# Patient Record
Sex: Female | Born: 1990 | Hispanic: Yes | Marital: Single | Smoking: Never smoker
Health system: Southern US, Community
[De-identification: ages and names within clinical notes are randomized; demographics above are authoritative.]

## PROBLEM LIST (undated history)

## (undated) DIAGNOSIS — R87629 Unspecified abnormal cytological findings in specimens from vagina: Secondary | ICD-10-CM

## (undated) DIAGNOSIS — D649 Anemia, unspecified: Secondary | ICD-10-CM

## (undated) DIAGNOSIS — O24419 Gestational diabetes mellitus in pregnancy, unspecified control: Secondary | ICD-10-CM

## (undated) HISTORY — DX: Anemia, unspecified: D64.9

## (undated) HISTORY — DX: Gestational diabetes mellitus in pregnancy, unspecified control: O24.419

## (undated) HISTORY — PX: OTHER SURGICAL HISTORY: SHX169

## (undated) HISTORY — DX: Unspecified abnormal cytological findings in specimens from vagina: R87.629

---

## 2015-11-15 LAB — OB RESULTS CONSOLE RUBELLA ANTIBODY, IGM: Rubella: IMMUNE

## 2015-12-26 LAB — SICKLE CELL SCREEN: Sickle Cell Screen: NORMAL

## 2016-05-07 ENCOUNTER — Encounter (INDEPENDENT_AMBULATORY_CARE_PROVIDER_SITE_OTHER): Payer: Self-pay

## 2016-05-07 ENCOUNTER — Inpatient Hospital Stay: Payer: Self-pay | Attending: Obstetrics and Gynecology | Admitting: Obstetrics and Gynecology

## 2016-05-07 ENCOUNTER — Encounter: Payer: Self-pay | Admitting: *Deleted

## 2016-05-07 ENCOUNTER — Ambulatory Visit: Payer: Self-pay | Attending: Oncology | Admitting: *Deleted

## 2016-05-07 VITALS — BP 104/72 | HR 58 | Temp 97.4°F | Resp 18 | Ht 60.0 in | Wt 170.0 lb

## 2016-05-07 DIAGNOSIS — R87612 Low grade squamous intraepithelial lesion on cytologic smear of cervix (LGSIL): Secondary | ICD-10-CM

## 2016-05-07 DIAGNOSIS — Z8742 Personal history of other diseases of the female genital tract: Secondary | ICD-10-CM | POA: Insufficient documentation

## 2016-05-07 NOTE — Progress Notes (Signed)
Chaperoned pelvic exam. Pap/hpv obtained from Dr. Sonia Side and sent to lab. Oncology Nurse Navigator Documentation      )

## 2016-05-07 NOTE — Progress Notes (Signed)
Gynecologic Oncology Consult Visit   Referring Provider: [facility]  Chief Concern: LGSIL Pap  Subjective:  Sierra Reynolds is a 26 y.o. G1P1 female who is seen in consultation from Sierra Posey, RN from [facility] for LGSIL Pap with HPV effect. Her Pap was obtained on 11/15/2015 during her pregnancy. Findings included HPV effect. She has no symptoms. She is very healthy and her only medical issues is Gestational Diabetes.    Problem List: Patient Active Problem List   Diagnosis Date Noted  . Low grade squamous intraepithelial lesion on cytologic smear of cervix (LGSIL) 05/07/2016    Past Medical History: Past Medical History:  Diagnosis Date  . Gestational diabetes     Past Surgical History: History reviewed. No pertinent surgical history.  Past Gynecologic History:  Menarche: 11 Menstrual details: Lasts 7 days Menses regular: yes every 25 days Last Menstrual Period: 04/15/2016 History of OCP/HRT use: not at this time, not sexually active at this time as her [place] is outside the country History of Abnormal pap: yes, as per interval history Last pap: as per interval history History of STDs: The patient denies history of sexually transmitted disease. Contraception: no method Sexually active: not at this time   OB History:  OB History  Gravida Para Term Preterm AB Living  1 1          SAB TAB Ectopic Multiple Live Births               # Outcome Date GA Lbr Len/2nd Weight Sex Delivery Anes PTL Lv  1 Para             Obstetric Comments  Delivery 01/29/2016    Family History: Family History  Problem Relation Age of Onset  . Heart disease Maternal Grandmother   . Heart disease Maternal Grandfather     Social History: Social History   Social History  . Marital status: Married    Spouse name: N/A  . Number of children: N/A  . Years of education: N/A   Occupational History  . Not on file.   Social History Main Topics  . Smoking status: Never Smoker  .  Smokeless tobacco: Never Used  . Alcohol use No  . Drug use: No  . Sexual activity: Yes   Other Topics Concern  . Not on file   Social History Narrative  . No narrative on file    Allergies: No Known Allergies  Current Medications: No current outpatient prescriptions on file.   No current facility-administered medications for this visit.     Review of Systems General: negative for, fevers Skin: negative Eyes: negative for, changes in vision HEENT: negative for, change in hearing, voice changes Pulmonary: negative for, dyspnea, productive cough Cardiac: negative for, palpitations, pain Gastrointestinal: positive for constipation, negative for, nausea, vomiting, diarrhea, hematemesis, hematochezia Genitourinary/Sexual: negative for, dysuria, incontinence Ob/Gyn: negative for, irregular bleeding, pain Musculoskeletal: negative for, pain Hematology: negative for, easy bruising, bleeding Neurologic/Psych: positive for headaches, negative for, seizures, paralysis, weakness. Headaches occur once a week.   Objective:  Physical Examination:  BP 104/72   Pulse (!) 58   Temp 97.4 F (36.3 C) (Tympanic)   Resp 18   Ht 5' (1.524 m)   Wt 170 lb (77.1 kg)   BMI 33.20 kg/m    ECOG Performance Status: 0 - Asymptomatic  General appearance: alert, cooperative and appears stated age HEENT:PERRLA, extra ocular movement intact and sclera clear, anicteric Lymph node survey: non-palpable, axillary, inguinal, supraclavicular Abdomen: soft, non-tender, without  masses or organomegaly Extremities: extremities normal, atraumatic, no cyanosis or edema Neurological exam reveals alert, oriented, normal speech, no focal findings or movement disorder noted.  Pelvic: exam chaperoned by nurse;  Vulva: normal appearing vulva with no masses, tenderness or lesions; Vagina: normal vagina; Adnexa: normal adnexa in size, nontender and no masses; Uterus:seems normal in size, shape, consistency and  appropriately tender, exam was limited by habitus; Cervix: no lesions; Rectal: not indicated  Colposcopy of the cervix  PROCEDURE: The risks and benefits of the procedure were reviewed and informed consent obtained. Time out was performed. The patient received pre-procedure teaching and expressed understanding. The post-procedure instructions were reviewed with the patient and she expressed understanding. The patient does not have any barriers to learning.  Colposcopy of the upper vagina and cervix was performed after application os acetic acid. The findings revealed AWE at 12 and 5 o'clock of the cervix as well as AWE area 10 mm along the right lateral vaginal wall at 9 o'clock - findings c/w CIN1 at the most. The transformation zone was seen. Biopsies were not performed. Procedure completed without complications. The patient tolerated the procedure well.   Post-procedure evaluation the patient was stable without complaints.  Physical Exam  Genitourinary:      Lab Review none  Radiologic Imaging: none    Assessment:  Sierra Reynolds is a 27 y.o. 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  Medical co-morbidities complicating care: Body mass index is 33.2 kg/m.  Plan:   Problem List Items Addressed This Visit      Other   Low grade squamous intraepithelial lesion on cytologic smear of cervix (LGSIL) - Primary      We discussed options for management and I recommended follow up of today's Pap and HPV tests. If LGSIL or normal then repeat cotesting in one year.   I recommended to [facility] that the patient follow up with her Ob/Gyn regarding contraception.   The patient's diagnosis, an outline of the further diagnostic and laboratory studies which will be required, the recommendation, and alternatives were discussed.  All questions were answered to the patient's satisfaction.  A total of 45 minutes were spent with  the patient/family today; 50% was spent in education, counseling and coordination of care for abnormal pap.   Sierra Laroche, MD

## 2016-05-07 NOTE — Progress Notes (Addendum)
26 year old Hispanic female referred to [facility] by the [facility] Department for further evaluation of an abormal pap dated 119/17 of LGSIL.  HPV was not tested.  Patient is postpartum from vaginal delivery January 2018.  Patient has been screened for eligibility.  She does not have any insurance, Medicare or Medicaid.  She also meets financial eligibility.  Hand-out given on the Affordable Care Act.  Dr. Sonia Side, GYN Oncologist here at the cancer center, was available today, and agreed to see the patient.  Colposcopy was preformed.  Per Dr. Sonia Side she did not need a biopsy, but she did do another pap.  Discussed with patient that she would need to follow up with Dr. Sonia Side in one year unless otherwise indicated.  Will schedule her once we have her pap results.  Maritza, the interpreter was present during the interview and exam.  Will follow up per protcol.

## 2016-05-12 LAB — PAP LB AND HPV HIGH-RISK
HPV, high-risk: POSITIVE — AB
PAP SMEAR COMMENT: 0

## 2016-05-19 ENCOUNTER — Ambulatory Visit: Payer: Self-pay

## 2016-05-20 ENCOUNTER — Encounter: Payer: Self-pay | Admitting: *Deleted

## 2016-05-20 NOTE — Progress Notes (Signed)
Letter mailed to inform patient of her pap smear results and need to return in one year per Dr. Malva CoganSecord's recommendation.  She is scheduled to see Dr. Sonia SideSecord at 9:00 on 05/06/17 and [facility] at 9:30.  [facility] indicated per Dr. Sonia SideSecord.  HSIS to [place].

## 2017-01-06 NOTE — L&D Delivery Note (Signed)
Delivery Note Primary OB: [facility] Delivery Physician: Annamarie MajorPaul Yoona Ishii, MD Gestational Age: Full term Antepartum complications: post-term Intrapartum complications: None  A viable Female was delivered via vertex perentation.  Apgars:8 ,9  Weight:  9 lb 5 oz .   Placenta status: spontaneous and Intact.  Cord: 3+ vessels;  with the following complications: none.  Anesthesia:  epidural Episiotomy:  none Lacerations:  2nd Suture Repair: 2.0 vicryl Est. Blood Loss (mL):  less than 100 mL  Mom to postpartum.  Baby to Couplet care / Skin to Skin.  Annamarie MajorPaul Han Vejar, MD, Merlinda FrederickFACOG [facility], [facility] 08/06/2017  3:23 PM [phone]

## 2017-02-17 ENCOUNTER — Other Ambulatory Visit: Payer: Self-pay | Admitting: Nurse Practitioner

## 2017-02-17 DIAGNOSIS — Z3482 Encounter for supervision of other normal pregnancy, second trimester: Secondary | ICD-10-CM

## 2017-03-10 ENCOUNTER — Ambulatory Visit
Admission: RE | Admit: 2017-03-10 | Discharge: 2017-03-10 | Disposition: A | Discharge status: Home / Self Care | Payer: Self-pay | Source: Ambulatory Visit | Attending: Nurse Practitioner | Admitting: Nurse Practitioner

## 2017-03-10 DIAGNOSIS — Z3482 Encounter for supervision of other normal pregnancy, second trimester: Secondary | ICD-10-CM | POA: Insufficient documentation

## 2017-03-10 DIAGNOSIS — Z363 Encounter for antenatal screening for malformations: Secondary | ICD-10-CM | POA: Insufficient documentation

## 2017-03-12 ENCOUNTER — Ambulatory Visit: Payer: Self-pay

## 2017-05-06 ENCOUNTER — Ambulatory Visit: Payer: Self-pay

## 2017-08-02 ENCOUNTER — Other Ambulatory Visit: Payer: Self-pay | Admitting: Obstetrics & Gynecology

## 2017-08-02 ENCOUNTER — Other Ambulatory Visit: Payer: Self-pay

## 2017-08-02 ENCOUNTER — Observation Stay
Admission: EM | Admit: 2017-08-02 | Discharge: 2017-08-02 | Disposition: A | Discharge status: Home / Self Care | Payer: Self-pay | Attending: Obstetrics & Gynecology | Admitting: Obstetrics & Gynecology

## 2017-08-02 DIAGNOSIS — Z3A00 Weeks of gestation of pregnancy not specified: Secondary | ICD-10-CM | POA: Insufficient documentation

## 2017-08-02 DIAGNOSIS — O480 Post-term pregnancy: Secondary | ICD-10-CM

## 2017-08-02 DIAGNOSIS — R109 Unspecified abdominal pain: Secondary | ICD-10-CM

## 2017-08-02 DIAGNOSIS — O26893 Other specified pregnancy related conditions, third trimester: Principal | ICD-10-CM | POA: Insufficient documentation

## 2017-08-02 DIAGNOSIS — Z3689 Encounter for other specified antenatal screening: Secondary | ICD-10-CM

## 2017-08-02 DIAGNOSIS — O26899 Other specified pregnancy related conditions, unspecified trimester: Secondary | ICD-10-CM | POA: Diagnosis present

## 2017-08-02 DIAGNOSIS — Z3A40 40 weeks gestation of pregnancy: Secondary | ICD-10-CM

## 2017-08-02 MED ORDER — ACETAMINOPHEN 325 MG PO TABS: 650.0000 mg | ORAL_TABLET | ORAL | Status: DC | PRN

## 2017-08-02 NOTE — Discharge Summary (Signed)
  See FPN 

## 2017-08-02 NOTE — Final Progress Note (Signed)
Physician Final Progress Note  Patient ID: Sierra Reynolds MRN: [id number] DOB/AGE: 29/01/1990 29 y.o.  Admit date: 08/02/2017 Admitting provider: Nadara Mustardobert P Keiasia Christianson, MD Discharge date: 08/02/2017  Admission Diagnoses: Abdominal pain, thirds trimester, Post Dates  Discharge Diagnoses:  Active Problems:   Cramping affecting pregnancy, antepartum     Consults: None  Significant Findings/ Diagnostic Studies: Pt seen, examines, and monitored in triage. Patient presented for evaluation of labor.  Patient had cervical exam by RN and this was reported to me. I reviewed her vital signs and fetal tracing, both of which were reassuring.  Patient was discharge as she was not laboring.  Procedures: A NST procedure was performed with FHR monitoring and a normal baseline established, appropriate time of 20-40 minutes of evaluation, and accels >2 seen w 15x15 characteristics.  Results show a REACTIVE NST.   Discharge Condition: good  Disposition: Discharge disposition: 01-Home or Self Care       Diet: Regular diet  Discharge Activity: Activity as tolerated  Discharge Instructions    Call MD for:   Complete by:  As directed    Worsening contractions or pain; leakage of fluid; bleeding.   Diet general   Complete by:  As directed    Increase activity slowly   Complete by:  As directed      Allergies as of 08/02/2017   Not on File     Medication List    TAKE these medications   prenatal multivitamin Tabs tablet Take 1 tablet by mouth daily at 12 noon.      Follow-up Information    [facility]. Go to.   Why:  return to the emergency department at 0500 am on Thursday August 1st 2019 for induction of labor  Contact information: [address] [place] [phone]          Total time spent taking care of this patient: TRIAGE  Signed: Letitia LibraRobert Paul Emelynn Rance 08/02/2017, 2:42 PM

## 2017-08-02 NOTE — OB Triage Note (Signed)
Patient discharged to home as she has not made any change. [facility] Spoke to dr Tiburcio Peaharris who agreed to induce patient on Thursday at 0500. Went over this information with the patient via interpreter about when to return to the hospital if labor progresses between now and Thursday. She understands and agrees to these instructions.

## 2017-08-02 NOTE — OB Triage Note (Signed)
Patient here for complaints of cramping. Denies LOF, has had some mucous and small amount pf bloody discharge the past few days

## 2017-08-03 ENCOUNTER — Encounter (HOSPITAL_COMMUNITY): Payer: Self-pay

## 2017-08-06 ENCOUNTER — Other Ambulatory Visit: Payer: Self-pay

## 2017-08-06 ENCOUNTER — Inpatient Hospital Stay
Admission: AD | Admit: 2017-08-06 | Discharge: 2017-08-07 | DRG: 807 | Disposition: A | Discharge status: Home / Self Care | Payer: Medicaid Other | Attending: Obstetrics & Gynecology | Admitting: Obstetrics & Gynecology

## 2017-08-06 ENCOUNTER — Inpatient Hospital Stay: Payer: Medicaid Other | Admitting: Registered Nurse

## 2017-08-06 DIAGNOSIS — O09293 Supervision of pregnancy with other poor reproductive or obstetric history, third trimester: Secondary | ICD-10-CM

## 2017-08-06 DIAGNOSIS — Z3A41 41 weeks gestation of pregnancy: Secondary | ICD-10-CM | POA: Diagnosis not present

## 2017-08-06 DIAGNOSIS — O99214 Obesity complicating childbirth: Secondary | ICD-10-CM | POA: Diagnosis present

## 2017-08-06 DIAGNOSIS — O480 Post-term pregnancy: Principal | ICD-10-CM | POA: Diagnosis present

## 2017-08-06 HISTORY — DX: Morbid (severe) obesity due to excess calories: E66.01

## 2017-08-06 LAB — CBC
HCT: 34.5 % — ABNORMAL LOW (ref 35.0–47.0)
HEMOGLOBIN: 11.7 g/dL — AB (ref 12.0–16.0)
MCH: 29.8 pg (ref 26.0–34.0)
MCHC: 33.9 g/dL (ref 32.0–36.0)
MCV: 88 fL (ref 80.0–100.0)
PLATELETS: 195 10*3/uL (ref 150–440)
RBC: 3.92 MIL/uL (ref 3.80–5.20)
RDW: 14.5 % (ref 11.5–14.5)
WBC: 4.4 10*3/uL (ref 3.6–11.0)

## 2017-08-06 LAB — TYPE AND SCREEN
ABO/RH(D): A POS
ANTIBODY SCREEN: NEGATIVE

## 2017-08-06 MED ORDER — IBUPROFEN 600 MG PO TABS
600.0000 mg | ORAL_TABLET | Freq: Four times a day (QID) | ORAL | Status: DC
  Administered 2017-08-06 – 2017-08-07 (×2): 600 mg via ORAL
  Filled 2017-08-06 (×4): qty 1

## 2017-08-06 MED ORDER — OXYTOCIN BOLUS FROM INFUSION
500.0000 mL | Freq: Once | INTRAVENOUS | Status: AC
  Administered 2017-08-06: 500 mL via INTRAVENOUS

## 2017-08-06 MED ORDER — BUPIVACAINE HCL (PF) 0.25 % IJ SOLN
INTRAMUSCULAR | Status: DC | PRN
  Administered 2017-08-06: 3 mL via EPIDURAL

## 2017-08-06 MED ORDER — ZOLPIDEM TARTRATE 5 MG PO TABS: 5.0000 mg | ORAL_TABLET | Freq: Every evening | ORAL | Status: DC | PRN

## 2017-08-06 MED ORDER — SODIUM CHLORIDE 0.9% FLUSH: 3.0000 mL | Freq: Two times a day (BID) | INTRAVENOUS | Status: DC

## 2017-08-06 MED ORDER — EPHEDRINE 5 MG/ML INJ
10.0000 mg | INTRAVENOUS | Status: DC | PRN
  Filled 2017-08-06: qty 2

## 2017-08-06 MED ORDER — DIBUCAINE 1 % RE OINT: 1.0000 "application " | TOPICAL_OINTMENT | RECTAL | Status: DC | PRN

## 2017-08-06 MED ORDER — WITCH HAZEL-GLYCERIN EX PADS: 1.0000 "application " | MEDICATED_PAD | CUTANEOUS | Status: DC | PRN

## 2017-08-06 MED ORDER — LIDOCAINE HCL (PF) 1 % IJ SOLN
INTRAMUSCULAR | Status: AC
  Filled 2017-08-06: qty 30

## 2017-08-06 MED ORDER — ONDANSETRON HCL 4 MG/2ML IJ SOLN: 4.0000 mg | Freq: Four times a day (QID) | INTRAMUSCULAR | Status: DC | PRN

## 2017-08-06 MED ORDER — TERBUTALINE SULFATE 1 MG/ML IJ SOLN: 0.2500 mg | Freq: Once | INTRAMUSCULAR | Status: DC | PRN

## 2017-08-06 MED ORDER — OXYTOCIN 40 UNITS IN LACTATED RINGERS INFUSION - SIMPLE MED
1.0000 m[IU]/min | INTRAVENOUS | Status: DC
  Administered 2017-08-06: 2 m[IU]/min via INTRAVENOUS
  Filled 2017-08-06: qty 1000

## 2017-08-06 MED ORDER — MISOPROSTOL 25 MCG QUARTER TABLET: 25.0000 ug | ORAL_TABLET | ORAL | Status: DC | PRN

## 2017-08-06 MED ORDER — ACETAMINOPHEN 325 MG PO TABS: 650.0000 mg | ORAL_TABLET | ORAL | Status: DC | PRN

## 2017-08-06 MED ORDER — OXYCODONE-ACETAMINOPHEN 5-325 MG PO TABS: 1.0000 | ORAL_TABLET | ORAL | Status: DC | PRN

## 2017-08-06 MED ORDER — BENZOCAINE-MENTHOL 20-0.5 % EX AERO: 1.0000 "application " | INHALATION_SPRAY | CUTANEOUS | Status: DC | PRN

## 2017-08-06 MED ORDER — COCONUT OIL OIL: 1.0000 "application " | TOPICAL_OIL | Status: DC | PRN

## 2017-08-06 MED ORDER — DIPHENHYDRAMINE HCL 50 MG/ML IJ SOLN: 12.5000 mg | INTRAMUSCULAR | Status: DC | PRN

## 2017-08-06 MED ORDER — ONDANSETRON HCL 4 MG PO TABS: 4.0000 mg | ORAL_TABLET | ORAL | Status: DC | PRN

## 2017-08-06 MED ORDER — OXYCODONE-ACETAMINOPHEN 5-325 MG PO TABS
2.0000 | ORAL_TABLET | ORAL | Status: DC | PRN
Start: 2017-08-06 — End: 2017-08-07

## 2017-08-06 MED ORDER — MISOPROSTOL 200 MCG PO TABS
ORAL_TABLET | ORAL | Status: AC
  Filled 2017-08-06: qty 4

## 2017-08-06 MED ORDER — BUTORPHANOL TARTRATE 1 MG/ML IJ SOLN: 1.0000 mg | INTRAMUSCULAR | Status: DC | PRN

## 2017-08-06 MED ORDER — FENTANYL 2.5 MCG/ML W/ROPIVACAINE 0.15% IN NS 100 ML EPIDURAL (ARMC)
EPIDURAL | Status: DC | PRN
  Administered 2017-08-06: 8 mL/h via EPIDURAL

## 2017-08-06 MED ORDER — LIDOCAINE HCL (PF) 1 % IJ SOLN
INTRAMUSCULAR | Status: DC | PRN
  Administered 2017-08-06: 3 mL via SUBCUTANEOUS

## 2017-08-06 MED ORDER — SIMETHICONE 80 MG PO CHEW: 80.0000 mg | CHEWABLE_TABLET | ORAL | Status: DC | PRN

## 2017-08-06 MED ORDER — LIDOCAINE-EPINEPHRINE (PF) 1.5 %-1:200000 IJ SOLN
INTRAMUSCULAR | Status: DC | PRN
  Administered 2017-08-06: 3 mL via EPIDURAL

## 2017-08-06 MED ORDER — FENTANYL 2.5 MCG/ML W/ROPIVACAINE 0.15% IN NS 100 ML EPIDURAL (ARMC)
EPIDURAL | Status: AC
  Filled 2017-08-06: qty 100

## 2017-08-06 MED ORDER — LACTATED RINGERS IV SOLN: 500.0000 mL | Freq: Once | INTRAVENOUS | Status: DC

## 2017-08-06 MED ORDER — OXYTOCIN 40 UNITS IN LACTATED RINGERS INFUSION - SIMPLE MED
2.5000 [IU]/h | INTRAVENOUS | Status: DC
  Filled 2017-08-06: qty 1000

## 2017-08-06 MED ORDER — DIPHENHYDRAMINE HCL 25 MG PO CAPS: 25.0000 mg | ORAL_CAPSULE | Freq: Four times a day (QID) | ORAL | Status: DC | PRN

## 2017-08-06 MED ORDER — ONDANSETRON HCL 4 MG/2ML IJ SOLN: 4.0000 mg | INTRAMUSCULAR | Status: DC | PRN

## 2017-08-06 MED ORDER — LACTATED RINGERS IV SOLN
500.0000 mL | INTRAVENOUS | Status: DC | PRN
  Administered 2017-08-06: 500 mL via INTRAVENOUS

## 2017-08-06 MED ORDER — SODIUM CHLORIDE 0.9% FLUSH: 3.0000 mL | INTRAVENOUS | Status: DC | PRN

## 2017-08-06 MED ORDER — PHENYLEPHRINE 40 MCG/ML (10ML) SYRINGE FOR IV PUSH (FOR BLOOD PRESSURE SUPPORT)
80.0000 ug | PREFILLED_SYRINGE | INTRAVENOUS | Status: DC | PRN
  Filled 2017-08-06: qty 5

## 2017-08-06 MED ORDER — FENTANYL 2.5 MCG/ML W/ROPIVACAINE 0.15% IN NS 100 ML EPIDURAL (ARMC): 12.0000 mL/h | EPIDURAL | Status: DC

## 2017-08-06 MED ORDER — SODIUM CHLORIDE 0.9 % IV SOLN: 250.0000 mL | INTRAVENOUS | Status: DC | PRN

## 2017-08-06 MED ORDER — LACTATED RINGERS IV SOLN
INTRAVENOUS | Status: DC
  Administered 2017-08-06: 05:00:00 via INTRAVENOUS

## 2017-08-06 MED ORDER — OXYTOCIN 10 UNIT/ML IJ SOLN
INTRAMUSCULAR | Status: AC
  Filled 2017-08-06: qty 2

## 2017-08-06 MED ORDER — SENNOSIDES-DOCUSATE SODIUM 8.6-50 MG PO TABS: 2.0000 | ORAL_TABLET | ORAL | Status: DC

## 2017-08-06 MED ORDER — AMMONIA AROMATIC IN INHA
RESPIRATORY_TRACT | Status: AC
  Filled 2017-08-06: qty 10

## 2017-08-06 NOTE — H&P (Addendum)
OB History & Physical   History of Present Illness:  Chief Complaint:  Presents for induction of labor HPI:  Sierra Reynolds is a 27 y.o. G76P1001 Hispanic female with EDC=08/07/2017 at [redacted]w[redacted]d dated by LMP 10/31/2016.  Her pregnancy has been complicated by obesity, close interconceptual spacing,  and history of diet controlled gestational diabetes with first pregnancy. She had an elevated 1 hour GTT=165 and both 3 hr GTTs were equivocal with one of four elevated levels. She reported having extensive lacerations after the delivery of her first baby. Review of record at [facility] reveals that she had a third degree laceration.  She presents to L&D for induction of labor for postdates.. She denies regular contractions, vaginal bleeding, or leakage of water.   Prenatal care site: Prenatal care at [facility] has also  been remarkable for a TWG of 29#. She had an abnormal Pap smear 11/15/2015: LGSIL with HPV. She either needs a colposcopy postpartum or a repeat Pap smear. She received TDAP on 05/19/2017. She plans on breast feeding and desires an epidural in labor. Anatomy scan at 19wk5d was normal and CGA within a week of dates. Placenta is posterior.      Maternal Medical History:   Past Medical History:  Diagnosis Date  . Gestational diabetes   . Morbid obesity (HCC) 08/06/2017   BMI=42.6  . Third degree perineal laceration    G1    Past Surgical History:  Procedure Laterality Date  . Repair of third degree laceration      No Known Allergies  Prior to Admission medications   Medication Sig Start Date End Date Taking? Authorizing Provider  Prenatal Vit-Fe Fumarate-FA (PRENATAL MULTIVITAMIN) TABS tablet Take 1 tablet by mouth daily at 12 noon.   Yes [provider]     Social History: She  reports that she has never smoked. She has never used smokeless tobacco. She reports that she does not drink alcohol or use drugs.  Family History: family history includes Heart disease in her  maternal grandfather and maternal grandmother.   Review of Systems: Negative x 10 systems reviewed except as noted in the HPI.      Physical Exam:  Vital Signs: BP 117/60 (BP Location: Right Arm)   Pulse 67   Temp 97.8 F (36.6 C) (Oral)   Resp 16   Ht 4\' 8"  (1.422 m)   Wt 86.2 kg (190 lb)   BMI 42.60 kg/m  General: no acute distress.  HEENT: normocephalic, atraumatic Heart: regular rate & rhythm.  No murmurs/rubs/gallops Lungs: clear to auscultation bilaterally Abdomen: soft, gravid, non-tender;  EFW: 9-9 1/2# Pelvic:   External: Normal external female genitalia  Cervix: Dilation: 4 / Effacement (%): 70, 80 / Station: -3 / mid/soft  Extremities: non-tender, symmetric, trace edema bilaterally.  DTRs: +1  Neurologic: Alert & oriented x 3.    Pertinent Results:  Prenatal Labs: Blood type/Rh A positive  Antibody screen negative  Rubella Immune  RPR Non reactive  HBsAg negative  HIV nonreactive  GC negative  Chlamydia negative  Genetic screening Not done  1 hour GTT 165  3 hour GTT 71/171/159/132 on 02/24/17 68/193/130/119  GBS negative on 07/02/2017   Baseline FHR:  130 with accelerations to 150s, moderate variability Toco: irregular, mild contractions    Assessment:  Sierra Reynolds is a 27 y.o. G57P1001 female at [redacted]w[redacted]d for induction of labor for postdates Bishop score 7 Suspect macrosomic infant Prior third degree laceration.   Plan:  1. Admit to Labor &  Delivery-  2. CBC, T&S, Clrs, IVF 3. GBS negative.   4. Consents obtained. 5. Long discussion via interpretor Renea Ee(Evelyn) regarding induction of labor, possible macrosomic infant, and prior perineal lacerations. DIscussed risks of hyperstimulation, FITL, Cesarean section. She wants a Cesarean section only if necessary. We discussed measures to try to prevent perineal lacerations. After discussion patient wished to continue with induction of labor. Plan Pitocin induction. 6. Epidural when appropriate 7. A  POS/ RI/ Varivax x2 8. Breast/ Nexplanon 9. TDAP UTD  Farrel Connersolleen Ario Mcdiarmid, CNM  Farrel ConnersColleen Maat Kafer  08/06/2017 7:09 AM

## 2017-08-06 NOTE — Progress Notes (Signed)
  Labor Progress Note   27 y.o. G2P1001 @ 5439w1d , admitted for  Pregnancy, Labor Management. IOL for Post dates  Subjective:  Comfortable w epidural  Objective:  BP 123/76   Pulse 66   Temp 98 F (36.7 C) (Oral)   Resp 16   Ht 4\' 8"  (1.422 m)   Wt 190 lb (86.2 kg)   BMI 42.60 kg/m  Abd: mild Extr: trace to 1+ bilateral pedal edema SVE: CERVIX: 7 cm dilated, 80 effaced, -3 station AROM CLEAR EFM: FHR: 140 bpm, variability: moderate,  accelerations:  Present,  decelerations:  Absent Toco: Frequency: Every 3-4 minutes Labs: I have reviewed the patient's lab results.   Assessment & Plan:  G2P1001 @ 8639w1d, admitted for  Pregnancy and Labor/Delivery Management  1. Pain management: epidural. 2. FWB: FHT category 1.  3. ID: GBS negative 4. Labor management: Cont active mgt of labor, pitocin  All discussed with patient, see orders  Annamarie MajorPaul Casy Brunetto, MD, Merlinda FrederickFACOG [facility], [facility] 08/06/2017  2:25 PM

## 2017-08-06 NOTE — Lactation Note (Signed)
This note was copied from a baby's chart. Lactation Consultation Note  Patient Name: Boy Reubin MilanJuana Romero Delgado ZOXWR'UToday's Date: 08/06/2017 Reason for consult: Follow-up assessment   Maternal Data Does the patient have breastfeeding experience prior to this delivery?: Yes  Feeding Feeding Type: Breast Fed Length of feed: 25 min  LATCH Score Latch: Repeated attempts needed to sustain latch, nipple held in mouth throughout feeding, stimulation needed to elicit sucking reflex.  Audible Swallowing: None  Type of Nipple: Everted at rest and after stimulation  Comfort (Breast/Nipple): Soft / non-tender  Hold (Positioning): Assistance needed to correctly position infant at breast and maintain latch.  LATCH Score: 6  Interventions Interventions: Breast feeding basics reviewed;Assisted with latch;Skin to skin;Adjust position;Support pillows  Lactation Tools Discussed/Used     Consult Status  LC to room to assist with latch and positioning of infant. [employer] mobile interpreter used. Mother states that she breast-fed her older child for 6 months. She also states that it seemed like she could never make enough milk but this time she has more colostrum than she previously had. Infant was able to successfully latch and LC adjusted position and provided support pillows. Mother was educated on signs of a good latch, hunger cues and the importance of skin to skin along with cluster-feeding and what to expect in the first 24 hours. Mother verbalized understanding and denies any questions or concerns at this time.    Arlyss Gandylicia Guy Toney 08/06/2017, 5:29 PM

## 2017-08-06 NOTE — Anesthesia Preprocedure Evaluation (Signed)
Anesthesia Evaluation  Patient identified by MRN, date of birth, ID band Patient awake    Reviewed: Allergy & Precautions, H&P , NPO status , Patient's Chart, lab work & pertinent test results  History of Anesthesia Complications Negative for: history of anesthetic complications  Airway Mallampati: III       Dental  (+) Teeth Intact   Pulmonary           Cardiovascular      Neuro/Psych  Neuromuscular disease    GI/Hepatic   Endo/Other  diabetes  Renal/GU      Musculoskeletal   Abdominal   Peds  Hematology   Anesthesia Other Findings   Reproductive/Obstetrics (+) Pregnancy                             Anesthesia Physical Anesthesia Plan  ASA: II  Anesthesia Plan: Epidural   Post-op Pain Management:    Induction:   PONV Risk Score and Plan:   Airway Management Planned:   Additional Equipment:   Intra-op Plan:   Post-operative Plan:   Informed Consent: I have reviewed the patients History and Physical, chart, labs and discussed the procedure including the risks, benefits and alternatives for the proposed anesthesia with the patient or authorized representative who has indicated his/her understanding and acceptance.     Plan Discussed with: Anesthesiologist  Anesthesia Plan Comments:         Anesthesia Quick Evaluation

## 2017-08-06 NOTE — Progress Notes (Signed)
  Labor Progress Note   27 y.o. G2P1001 @ 3024w1d , admitted for  Pregnancy, Labor Management.   Subjective:  Mild pains.  IOL for post dates, on Pitocin 13 mU/min  Objective:  BP 123/76   Pulse 66   Temp 98 F (36.7 C) (Oral)   Resp 16   Ht 4\' 8"  (1.422 m)   Wt 190 lb (86.2 kg)   BMI 42.60 kg/m  Abd: mild Extr: trace to 1+ bilateral pedal edema SVE: CERVIX: 4 cm dilated, 70 effaced, -3 station  EFM: FHR: 140 bpm, variability: moderate,  accelerations:  Present,  decelerations:  Absent Toco: Frequency: Every 3-5 minutes Labs: I have reviewed the patient's lab results.   Assessment & Plan:  G2P1001 @ 3024w1d, admitted for  Pregnancy and Labor/Delivery Management  1. Pain management: none. 2. FWB: FHT category 1.  3. ID: GBS negative 4. Labor management: Too high station for AROM currently Cont Pitocin Epidural when appropriate  All discussed with patient, see orders  Annamarie MajorPaul Zakyah Yanes, MD, Merlinda FrederickFACOG [facility], [facility] 08/06/2017  12:01 PM

## 2017-08-06 NOTE — Anesthesia Procedure Notes (Signed)
Epidural  Start time: 08/06/2017 1:52 PM End time: 08/06/2017 1:57 PM  Staffing Anesthesiologist: Yves Dillarroll, Paul, MD Resident/CRNA: Karoline CaldwellStarr, Ragan Duhon, CRNA Performed: resident/CRNA   Preanesthetic Checklist Completed: patient identified, site marked, surgical consent, pre-op evaluation, IV checked, risks and benefits discussed and monitors and equipment checked  Epidural Patient position: sitting Prep: ChloraPrep Patient monitoring: heart rate Approach: midline Location: L3-L4 Injection technique: LOR saline  Needle:  Needle type: Tuohy  Needle gauge: 17 G Needle length: 9 cm Needle insertion depth: 8 cm Catheter type: closed end flexible Catheter size: 19 Gauge Catheter at skin depth: 13 cm Test dose: negative and 1.5% lidocaine with Epi 1:200 K  Assessment Events: blood not aspirated, injection not painful, no injection resistance, negative IV test and no paresthesia  Additional Notes Reason for block:procedure for pain

## 2017-08-06 NOTE — Discharge Summary (Signed)
OB Discharge Summary     Patient Name: Sierra Reynolds DOB: 1990/03/05 MRN: [id number]  Date of admission: 08/06/2017 Delivering MD: Letitia Libra, MD  Date of Delivery: 08/06/2017  Date of discharge: 08/07/2017  Admitting diagnosis: Induction Intrauterine pregnancy: [redacted]w[redacted]d     Secondary diagnosis: Post Term     Discharge diagnosis: Term Pregnancy Delivered, Post term                         Hospital course:  Induction of Labor With Vaginal Delivery   27 y.o. yo [id number] at [redacted]w[redacted]d was admitted to the hospital 08/06/2017 for induction of labor.  Indication for induction: Postdates.  Patient had an uncomplicated labor course as follows: Membrane Rupture Time/Date: 2:25 PM ,08/06/2017   Intrapartum Procedures: Episiotomy: None [1]                                         Lacerations:  2nd degree [3]  Patient had delivery of a Viable infant.  Information for the patient's newborn:  Judine, Arciniega [[id number]]  Delivery Method: Vag-Spont   08/06/2017  Details of delivery can be found in separate delivery note.  Patient had a routine postpartum course. Patient is discharged home 08/07/17.                                                                 Post partum procedures:none  Complications: None  Physical exam on 08/07/2017: Vitals:   08/06/17 2033 08/07/17 0028 08/07/17 0431 08/07/17 0844  BP: 121/63 114/61 113/70 111/71  Pulse: (!) 50 63 60 60  Resp: 18 20 18 20   Temp: 99.7 F (37.6 C) 98.3 F (36.8 C) 98.2 F (36.8 C) 98.1 F (36.7 C)  TempSrc: Oral Oral Oral Oral  SpO2: 98% 100% 99% 100%  Weight:      Height:       General: alert, cooperative and no distress Lochia: appropriate Uterine Fundus: firm Incision: N/A DVT Evaluation: No evidence of DVT seen on physical exam.  Labs: Lab Results  Component Value Date   WBC 5.6 08/07/2017   HGB 10.6 (L) 08/07/2017   HCT 30.7 (L) 08/07/2017   MCV 88.7 08/07/2017   PLT 159 08/07/2017   No flowsheet data  found.  Discharge instruction: per After Visit Summary.  Medications:  Allergies as of 08/07/2017   No Known Allergies     Medication List    TAKE these medications   prenatal multivitamin Tabs tablet Take 1 tablet by mouth daily at 12 noon.       Diet: routine diet  Activity: Advance as tolerated. Pelvic rest for 6 weeks.   Outpatient follow up: Follow-up Information    Department, [facility]. Schedule an appointment as soon as possible for a visit in 6 week(s).   Why:  postpartum follow up and Nexplanon insertion Contact information: [address] [place] [address] [phone]             Postpartum contraception: Nexplanon Rhogam Given postpartum: no Rubella vaccine given postpartum: no Varicella vaccine given postpartum: no TDaP given antepartum or postpartum: Yes  Newborn Data: Live born female  Birth Weight:  4220 g APGAR: 8, 9  Newborn Delivery   Birth date/time:  08/06/2017 15:08:00 Delivery type:  Vaginal, Spontaneous      Baby Feeding: Breast  Disposition:home with mother  SIGNED:  Tresea MallJane Jawad Wiacek, CNM 08/07/2017 3:54 PM

## 2017-08-07 LAB — CBC
HCT: 30.7 % — ABNORMAL LOW (ref 35.0–47.0)
HEMOGLOBIN: 10.6 g/dL — AB (ref 12.0–16.0)
MCH: 30.7 pg (ref 26.0–34.0)
MCHC: 34.6 g/dL (ref 32.0–36.0)
MCV: 88.7 fL (ref 80.0–100.0)
PLATELETS: 159 10*3/uL (ref 150–440)
RBC: 3.46 MIL/uL — AB (ref 3.80–5.20)
RDW: 14.6 % — ABNORMAL HIGH (ref 11.5–14.5)
WBC: 5.6 10*3/uL (ref 3.6–11.0)

## 2017-08-07 LAB — RPR: RPR Ser Ql: NONREACTIVE

## 2017-08-07 NOTE — Progress Notes (Signed)
Pt. Given discharge instructions printed in spanish and also verbally by interpretor. Pt verbalized understanding. Armbands verified, security tag removed. Follow up appointment made with [facility] and pt. Instructed. Pt discharged via wheelchair.

## 2017-08-07 NOTE — Progress Notes (Signed)
Post Partum Day 1 Subjective: Doing well, no complaints. She is ambulating and voiding without difficulty. She is tolerating PO intake and her pain is well controlled with PO pain medications.   No CP SOB F/C N/V or leg pain No HA, change of vision, RUQ/epigastric pain  Objective: BP 111/71 (BP Location: Right Arm)   Pulse 60   Temp 98.1 F (36.7 C) (Oral)   Resp 20   Ht 4\' 8"  (1.422 m)   Wt 190 lb (86.2 kg)   SpO2 100%   Breastfeeding   BMI 42.60 kg/m    Physical Exam:  General: NAD CV: RRR Pulm: nl effort, CTABL Lochia: moderate Uterine Fundus: fundus firm and below umbilicus DVT Evaluation: no cords, ttp LEs   Recent Labs    08/06/17 0520 08/07/17 0454  HGB 11.7* 10.6*  HCT 34.5* 30.7*  WBC 4.4 5.6  PLT 195 159    Assessment/Plan: 27 y.o. G2P2002 postpartum day # 1  1. Continue routine postpartum care 2. A positive, Rubella Immune, Varivax x2 3. TDAP status: given antepartum 4. Breastfeeding/Contraception: Nexplanon 5. Disposition: Possible discharge later today pending newborn discharge   Tresea MallJane Chaniyah Jahr, CNM

## 2017-08-07 NOTE — Lactation Note (Signed)
This note was copied from a baby's chart. Lactation Consultation Note  Patient Name: Boy Reubin MilanJuana Romero Delgado ZOXWR'UToday's Date: 08/07/2017     Maternal Data    Feeding Feeding Type: Breast Fed Length of feed: 60 min  LATCH Score                   Interventions    Lactation Tools Discussed/Used     Consult Status   Via interpreter LC answered breastfeeding questions parents had concerning establishing milk supply. We reviewed clusterfeeding, input and output, how to know when a baby is full, and what infant feeding cues are. Parents know they have access to breastfeeding assistance in hospital [facility] Armed forces operational officerpanish-Speaking Peer Counselor.   Burnadette PeterJaniya M Larron Armor 08/07/2017, 5:16 PM

## 2017-08-07 NOTE — Discharge Instructions (Signed)
Instrucciones para la mam sobre los cuidados en el hogar (Home Care Instructions for Mom) ACTIVIDAD  Reanude sus actividades regulares de forma gradual.  Descanse. Tome siestas cuando el beb duerme.  No levante objetos que pesen ms de 10libras (4,5kg) hasta que el mdico se lo autorice.  Evite las actividades que demandan mucho esfuerzo y Teacher, early years/pre (que son extenuantes) hasta que el mdico se lo autorice. Caminar a un ritmo tranquilo a moderado siempre es ms seguro.  Si tuvo un parto por cesrea: ? No pase la aspiradora, suba escaleras o conduzca un vehculo durante 4 o 6 semanas. ? Pdale a alguien que le brinde ayuda con las tareas [employer] que pueda realizarlas por su cuenta. ? Haga ejercicios como se lo haya indicado el mdico, si corresponde.  HEMORRAGIA VAGINAL Probablemente contine sangrando durante 4 o 6 semanas despus del parto. Generalmente, la cantidad de sangre disminuye y el color se hace ms claro con el transcurso del Aspers. Sin embargo, si usted est [employer] Waste Industries, el color de la sangre puede ser rojo brillante. Si necesita cambiarse la compresa higinica en menos de una hora o tiene cogulos grandes:  Permanezca acostada.  Eleve los pies.  Coloque compresas fras en la zona inferior del abdomen.  Haga reposo.  Comunquese con su mdico. Si est amamantando, podra volver a tener su perodo [employer] 8 semanas despus del parto y el momento en que deje de Economist. Si no est amamantando, volver a tener su perodo 6 u 8 semanas despus del [place]. CUIDADOS PERINEALES La zona perineal o perineo, es la parte del cuerpo que se encuentra entre los muslos. Despus del parto, esta zona necesita un cuidado especial. [place] siguientes indicaciones como se lo haya indicado su mdico.  Tome baos de inmersin durante 15 o 20 minutos.  Utilice apsitos o [employer] Mutual [employer] y Nature conservation officer se lo hayan indicado.  No utilice tampones ni se haga duchas  vaginales hasta que el sangrado vaginal se haya detenido.  Cada vez que vaya al bao: ? Use una botella perineal. ? Cmbiese el apsito. ? Use papel tis en lugar de papel higinico hasta que se cure la sutura.  Haga ejercicios de [employer]. Los ejercicios Kegel ayudan a Theatre manager los msculos que sostienen la vagina, la vejiga y los intestinos. Estos ejercicios se pueden Regulatory affairs officer est parada, sentada o acostada. Para hacer los ejercicios de Kegel: ? Tense los msculos del estmago y los que rodean el canal de [place]. ? Mantenga esta posicin durante unos segundos. ? Reljese. ? Repita hasta hacerlos 5 veces seguidas.  Para evitar las hemorroides o que estas empeoren: ? Beba suficiente lquido para mantener la orina clara o de color amarillo plido. ? Evite hacer fuerza al defecar. ? Tome los Dynegy y laxantes de venta libre como se lo haya indicado el mdico. CUIDADO DE LAS MAMAS  Use un buen sostn.  Evite tomar analgsicos de venta libre para las [employer].  Aplique hielo en los pechos para aliviar las molestias tanto como sea necesario: ? Field seismologist hielo en una bolsa plstica. ? Coloque una Genuine Parts piel y la bolsa de hielo. ? Aplique el hielo durante 20, o como se lo haya indicado el mdico.  NUTRICIN  Mantenga una dieta bien balanceada.  No intente perder de peso rpidamente reduciendo el consumo de caloras.  Tome sus vitaminas prenatales hasta el control de postparto o hasta que su mdico se lo indique.  DEPRESIN  POSTPARTO Puede sentir deseos de llorar sin motivo aparente y verse incapaz de enfrentarse a todos los cambios que implica tener un beb. Este estado de nimo se llama depresin postparto. La depresin postparto ocurre porque sus niveles hormonales sufren cambios despus del [place]. Si usted tiene depresin postparto, busque contencin por parte de su pareja, sus amigos y su familia. Si la depresin no desaparece por  s sola despus de algunas semanas, concurra a su mdico. AUTOEXAMEN DE MAMAS Realcese autoexmenes en el mismo momento cada mes. Si est amamantando, el mejor momento de controlar sus mamas es despus de Corporate treasurer al beb, cuando los pechos no estn tan llenos. Si est amamantando y su perodo ya comenz, controle sus mamas el da 5, 6 o 7 de su perodo. Informe a su mdico de cualquier protuberancia, bulto o secrecin. Si est amamantando, las [employer]. Esto es transitorio y no es un riesgo para Radiographer, therapeutic. INTIMIDAD Y SEXUALIDAD Debe evitar las relaciones sexuales durante al menos 3 o 4 semanas despus del parto o hasta que el flujo de color rojo amarronado haya desaparecido completamente. Si no desea quedar embarazada nuevamente, use algn mtodo anticonceptivo. Despus del parto, puede quedar embarazada incluso si no ha tenido todava el perodo. SOLICITE ATENCIN MDICA SI:  Se siente incapaz de controlar los cambios que implica tener un hijo y esos sentimientos no desaparecen despus de Armed forces technical officer.  Detecta una protuberancia, bulto o secrecin en sus mamas.  SOLICITE ATENCIN MDICA DE INMEDIATO SI:  Debe cambiarse la compresa higinica en 1 hora o menos.  Tiene los siguientes sntomas: ? Dolor intenso o calambres en la parte inferior del abdomen. ? Una secrecin vaginal con mal olor. ? Fiebre que no se BJ's. ? Una zona de la mama se pone roja y Presenter, broadcasting, y adems usted tiene fiebre. ? Una pantorrilla enrojecida y con dolor. ? Repentino e intenso dolor en el pecho. ? Falta de aire. ? Miccin dolorosa o con sangre. ? Problemas visuales.  Vmitos durante 12 horas o ms.  Dolor de cabeza intenso.  Tiene pensamientos serios acerca de lastimarse a usted misma o daar al nio o a otra persona.  Esta informacin no tiene Theme park manager el consejo del mdico. Asegrese de hacerle al mdico cualquier pregunta que  tenga. Document Released: 12/23/2004 Document Revised: 04/16/2015 Document Reviewed: 06/26/2014 Elsevier Interactive Patient Education  2017 Arvin[employer]itor. Parto normal, cuidados aps o procedimento Vaginal Delivery, Care After Freescale Sem[employer]ductor folheto nas prximas semanas. Essas instrues fornecem informaes sobre como cuidar de si mesma aps um parto normal. Seu mdico tambm poder fornecer instrues mais especficas. Seu tratamento foi planejado de acordo com as prticas mdicas atuais, mas s vezes problemas podem ocorrer. Ligue para o seu mdico caso tenha qualquer Computer Sciences Corporation ou dvidas a seguir. O que posso esperar aps o procedimento? Aps um parto normal,  comum ocorrer:  Algum sangramento pela vagina.  Dor no abdome, na vagina e na pele entre a Print production planner vagina e o nus (perneo).  Clicas plvicas.  Fadiga.  Siga essas instrues em casa: Medicamentos  Tome medicamentos vendidos com ou sem receita mdica somente de acordo com as indicaes do seu mdico.  Caso tenha recebido uma prescrio de antibitico, tome-o somente como determinado pelo seu mdico. No pare de tomar o antibitico at [employer]. Direo   No dirija nem opere mquinas pesadas enquanto estiver tomando analgsicos vendidos com receita mdica.  No dirija por 24  horas caso tenha recebido Continental Airlines. Estilo de vida  No beba lcool. Isso ser especialmente importante caso voc esteja amamentando ou tomando medicamentos para Paramedic a dor.  No use derivados do tabaco, incluindo cigarros, tabaco de mascar ou cigarros eletrnicos. Caso precise de ajuda para parar de fumar, fale com seu mdico. Alimentos e bebidas  Beba pelo menos oito copos de 8 onas de gua todos os dias a menos que seu mdico diga para voc no fazer isso. Caso opte por amamentar seu beb, voc poder precisar beber [employer].  Consuma alimentos com elevado teor de fibras todos os dias. Esses alimentos podero  ajudar a Agricultural consultant a constipao. Alimentos ricos em fibras incluem: ? Cereais e pes integrais. ? Arroz integral. ? Feijes. ? Frutas e legumes frescos. Atividades  Retorne s suas atividades normais de acordo com as orientaes do seu mdico. Pergunte ao seu mdico quais atividades so seguras para voc.  Repouse o mximo possvel. Tente descansar ou tirar uma soneca quando seu beb estiver dormindo.  No levante nada que seja mais pesado que o seu beb ou pese mais de 10 libras (4,5 kg) at [employer] mdico dizer que  seguro faz-lo.  Converse com seu mdico sobre quando voc poder ter relaes sexuais. Isso poder depender: ? Do risco de infeces. ? Da velocidade Biomedical engineer. ? Do conforto e do desejo de realizar atividade sexual. Homer cuidar da vagina  Caso tenha realizado episiotomia ou sofrido lacerao vaginal, verifique a rea todos os dias em busca de sinais de infeco. Verifique a ocorrncia de: ? Aumento da vermelhido, do Film/video editor. ? Aumento da quantidade de lquido ou sangue. ? Calor. ? Pus ou mau cheiro.  No use absorventes internos nem faa duchas vaginais at o seu mdico dizer que  seguro faz-lo.  Fique atenta para cogulos que possam ser expelidos pela sua vagina. Esses cogulos podem se parecer com acmulos de secreo de cor vermelha escura, marrom ou preta. Instrues gerais  Mantenha seu perneo limpo e seco de acordo com as orientaes do seu mdico.  Use roupas confortveis e largas.  Limpe-se com movimentos da frente para trs aps usar o vaso sanitrio.  Pergunte ao seu mdico se voc pode tomar banho. Caso tenha realizado episiotomia ou sofrido lacerao perineal durante o trabalho de parto ou durante o parto, seu mdico poder lhe dizer para no tomar banhos por um determinado perodo.  Use um suti que oferece apoio aos seus seios e seja do tamanho adequado.  Se possvel, pea a algum para ajudar voc com as tarefas  domsticas e ajude a tomar conta do beb por pelo menos alguns dias depois que voc deixar o hospital.  Comparea a todas as consultas de acompanhamento suas e do beb de acordo com as orientaes do seu mdico. Isso  importante. Entre em contato com um mdico se:  Voc apresentar: ? Secreo vaginal com cheiro ruim. ? Dificuldade para urinar. ? Dor ao urinar. ? Aumento ou reduo sbita da [employer] Products [employer]s suas defecaes. ? Aumento da vermelhido, do inchao ou da dor na episiotomia ou lacerao vaginal. ? Mais lquido ou sangue saindo da episiotomia ou lacerao vaginal. ? Pus ou cheiro ruim oriundo da episiotomia ou lacerao vaginal. ? Danna Hefty. ? Uma erupo cutnea. ? Pouco ou nenhum interesse nas atividades de que voc [place]. ? Dvidas sobre como cuidar de voc mesma ou do beb.  Sua episiotomia ou lacerao vaginal parecer quente ao toque.  Sua episiotomia ou lacerao vaginal  se abrir e no Visual merchandiserparecer estar cicatrizando.  Suas mamas ficarem doloridas, duras ou avermelhadas.  Voc se sentir incomumente triste ou preocupada.  Sentir enjoo ou vomitar.  Expelir grandes cogulos de sangue pela vagina. Caso isso acontea, guarde os cogulos de sua vagina para mostr-los ao seu mdico. No elimine cogulos no vaso sanitrio sem que seu mdico os examine.  Urinar mais do que o normal.  Sentir tontura ou vertigem.  No tiver amamentado nenhuma vez e no menstruar por 12 semanas aps o parto.  Tiver parado de amamentar e no menstruar por 12 semanas aps parar de amamentar. Tomasa Hosebtenha ajuda imediatamente se:  Voc apresentar: ? Dor que no desaparece ou no melhora com o medicamento. ? Dor no peito. ? Dificuldade para respirar. ? Viso embaada ou manchas na viso. ? Pensar em fazer mal a si mesma ou ao seu beb.  Surgirem dores no abdome ou em Parker Hannifinuma das pernas.  Sentir dor de cabea intensa.  Desmaiar.  Sangrar muito pela vagina a ponto de usar dois absorventes em uma  hora. Estas informaes no se destinam a substituir as recomendaes de seu mdico. No deixe de discutir quaisquer dvidas com seu mdico. Document Released: 12/23/2004 Document Revised: 04/16/2016 Document Reviewed: 01/07/2015 Elsevier Interactive Patient Education  2018 Arvin[employer]itor[employer]vier Inc.

## 2017-08-07 NOTE — Anesthesia Postprocedure Evaluation (Signed)
Anesthesia Post Note  Patient: Sierra Reynolds  Procedure(s) Performed: AN AD HOC LABOR EPIDURAL  Patient location during evaluation: Women's Unit Anesthesia Type: Epidural Level of consciousness: awake, awake and alert and oriented Pain management: pain level controlled Vital Signs Assessment: post-procedure vital signs reviewed and stable Respiratory status: spontaneous breathing, nonlabored ventilation and respiratory function stable Cardiovascular status: blood pressure returned to baseline Postop Assessment: no headache, no backache, patient able to bend at knees, no apparent nausea or vomiting, adequate PO intake and able to ambulate Anesthetic complications: no     Last Vitals:  Vitals:   08/07/17 0028 08/07/17 0431  BP: 114/61 113/70  Pulse: 63 60  Resp: 20 18  Temp: 36.8 C 36.8 C  SpO2: 100% 99%    Last Pain:  Vitals:   08/07/17 0431  TempSrc: Oral  PainSc:                  Lyn RecordsNoles,  Trasean Delima R

## 2019-01-07 NOTE — L&D Delivery Note (Signed)
Delivery Summary for Sierra Reynolds  Labor Events:   Preterm labor: No data found  Rupture date: 08/25/2019  Rupture time: 7:55 PM  Rupture type: Spontaneous Intact  Fluid Color: Clear  Induction: No data found  Augmentation: No data found  Complications: No data found  Cervical ripening: No data found No data found   No data found     Delivery:   Episiotomy: No data found  Lacerations: No data found  Repair suture: No data found  Repair # of packets: No data found  Blood loss (ml): 100   Information for the patient's newborn:  Elyn, Krogh [[id number]]    Delivery 08/25/2019 7:55 PM by  Vaginal, Spontaneous Sex:  female Gestational Age: [redacted]w[redacted]d Delivery Clinician:   Living?:         APGARS  One minute Five minutes Ten minutes  Skin color:        Heart rate:        Grimace:        Muscle tone:        Breathing:        Totals: 8  9      Presentation/position:      Resuscitation:   Cord information:    Disposition of cord blood:     Blood gases sent?  Complications:   Placenta: Delivered:       appearance Newborn Measurements: Weight: 7 lb 6.2 oz (3350 g)  Height: 19.69"  Head circumference:    Chest circumference:    Other providers:    Additional  information: Forceps:   Vacuum:   Breech:   Observed anomalies no anomalies noted at delivery      Delivery Note At 7:55 PM a viable and healthy female was delivered via Vaginal, Spontaneous (Presentation: Vertex; Left Occiput Anterior).  APGAR: 8, 9; weight 7 lb 6.2 oz (3350 g).   Placenta status: Spontaneous, Intact.  Cord: 3 vessels with the following complications: None.  Cord pH: not obtained.  Delayed cord clamping was observed.   Anesthesia: None Episiotomy: None Lacerations: 1st degree;Perineal (hemostatic) Suture Repair: None Est. Blood Loss (mL): 100  Mom to postpartum.  Baby to Couplet care / Skin to Skin.  Hildred Laser, MD 08/25/2019, 10:17 PM

## 2019-02-28 ENCOUNTER — Ambulatory Visit (LOCAL_COMMUNITY_HEALTH_CENTER): Payer: Self-pay

## 2019-02-28 ENCOUNTER — Other Ambulatory Visit: Payer: Self-pay

## 2019-02-28 VITALS — BP 97/64 | Ht 62.0 in | Wt 173.0 lb

## 2019-02-28 DIAGNOSIS — Z3201 Encounter for pregnancy test, result positive: Secondary | ICD-10-CM

## 2019-02-28 LAB — PREGNANCY, URINE: Preg Test, Ur: POSITIVE — AB

## 2019-02-28 MED ORDER — PRENATAL VITAMINS 28-0.8 MG PO TABS: 28.0000 mg | ORAL_TABLET | Freq: Every day | ORAL | 0 refills | Status: AC

## 2019-02-28 NOTE — Progress Notes (Signed)
Will schedule appt with [facility] for [facility] Richmond Campbell, RN

## 2019-03-07 NOTE — Progress Notes (Signed)
Record abstracted per 03/04/19 phone interview with Hal Hope, RN; Sharlette Dense, RN

## 2019-03-08 ENCOUNTER — Other Ambulatory Visit: Payer: Self-pay

## 2019-03-08 ENCOUNTER — Encounter: Payer: Self-pay | Admitting: Family Medicine

## 2019-03-08 ENCOUNTER — Ambulatory Visit: Payer: Medicaid Other | Admitting: Family Medicine

## 2019-03-08 VITALS — BP 90/60 | HR 70 | Temp 97.0°F | Wt 172.4 lb

## 2019-03-08 DIAGNOSIS — O99212 Obesity complicating pregnancy, second trimester: Secondary | ICD-10-CM

## 2019-03-08 DIAGNOSIS — Z3480 Encounter for supervision of other normal pregnancy, unspecified trimester: Secondary | ICD-10-CM | POA: Diagnosis not present

## 2019-03-08 DIAGNOSIS — Z8742 Personal history of other diseases of the female genital tract: Secondary | ICD-10-CM | POA: Diagnosis not present

## 2019-03-08 DIAGNOSIS — O0990 Supervision of high risk pregnancy, unspecified, unspecified trimester: Secondary | ICD-10-CM | POA: Insufficient documentation

## 2019-03-08 LAB — URINALYSIS
Bilirubin, UA: NEGATIVE
Ketones, UA: NEGATIVE
Nitrite, UA: NEGATIVE
Protein,UA: NEGATIVE
RBC, UA: NEGATIVE
Specific Gravity, UA: 1.02 (ref 1.005–1.030)
Urobilinogen, Ur: 0.2 mg/dL (ref 0.2–1.0)
pH, UA: 7 (ref 5.0–7.5)

## 2019-03-08 LAB — WET PREP FOR TRICH, YEAST, CLUE
Trichomonas Exam: NEGATIVE
Yeast Exam: NEGATIVE

## 2019-03-08 LAB — HEMOGLOBIN, FINGERSTICK: Hemoglobin: 12.2 g/dL (ref 11.1–15.9)

## 2019-03-08 LAB — HIV ANTIBODY (ROUTINE TESTING W REFLEX): HIV 1&2 Ab, 4th Generation: NONREACTIVE

## 2019-03-08 NOTE — Progress Notes (Signed)
[facility] HEALTH DEPT [facility] [address] [place] [address] [phone]  INITIAL PRENATAL VISIT NOTE  Subjective:  Sierra Reynolds is a 29 y.o. G3P2002 at [redacted]w[redacted]d being seen today to start prenatal care at the [facility] Department.  She is currently monitored for the following issues for this low-risk pregnancy and has Low grade squamous intraepithelial lesion on cytologic smear of cervix (LGSIL); Supervision of other normal pregnancy, antepartum; and Obesity affecting pregnancy in second trimester on their problem list.  Pt presents for initial OB visit. States she is feeling good about this pregnancy, lives with husband 2 children, ages 10 and 1 yr 8 mo. She is not currently working outside the home. Her husband works in Airline pilot. Physically she had severe nausea but this is now improving. She denies chronic medical issues, takes no medications daily (other than PNV). Her last pap was 02/19/2017, result NIL, neg HPV. 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 She denies alcohol, tobacco or substance use. Her prior 2 deliveries were SVD at 41 wks with infant wt >6 lbs. She denies hx of preeclampsia or GHTN, though did have GDM with 1st pregnancy. She denies family hx of genetic abnormalities.    Contractions: Not present. Vag. Bleeding: None.  Movement: Absent. Denies leaking of fluid.   Indications for ASA therapy (per uptodate) One of the following: Previous pregnancy with preeclampsia, especially early onset and with an adverse outcome No Multifetal gestation No Chronic hypertension No Type 1 or 2 diabetes mellitus No Chronic kidney disease No Autoimmune disease (antiphospholipid syndrome, systemic lupus erythematosus) No  Two or more of the following: Nulliparity No Obesity (body mass index >30 kg/m2) Yes Family history of preeclampsia in mother or sister No Age ?35 years  No Sociodemographic characteristics (African American race, low socioeconomic level) Yes Personal risk factors (eg, previous pregnancy with low birth weight or small for gestational age infant, previous adverse pregnancy outcome [eg, stillbirth], interval >10 years between pregnancies) No   The following portions of the patient's history were reviewed and updated as appropriate: allergies, current medications, past family history, past medical history, past social history, past surgical history and problem list. Problem list updated.  Objective:   Vitals:   03/08/19 0851  BP: 90/60  Pulse: 70  Temp: (!) 97 F (36.1 C)  Weight: 172 lb 6.4 oz (78.2 kg)    Fetal Status: Fetal Heart Rate (bpm): 150 Fundal Height: 15 cm Movement: Absent  Presentation: Undeterminable   Physical Exam Vitals and nursing note reviewed.  Constitutional:      General: She is not in acute distress.    Appearance: Normal appearance. She is well-developed.  HENT:     Head: Normocephalic and atraumatic.     Right Ear: External ear normal.     Left Ear: External ear normal.     Nose: Nose normal. No congestion or rhinorrhea.     Mouth/Throat:     Lips: Pink.     Mouth: Mucous membranes are moist.     Dentition: Normal dentition. No dental caries.     Pharynx: Oropharynx is clear. Uvula midline.  Eyes:     General: No scleral icterus.    Conjunctiva/sclera: Conjunctivae normal.  Neck:     Thyroid: No thyroid mass or thyromegaly.  Cardiovascular:     Rate and Rhythm: Normal rate.     Pulses: Normal pulses.     Comments: Extremities are  warm and well perfused Pulmonary:     Effort: Pulmonary effort is normal.     Breath sounds: Normal breath sounds.  Chest:     Breasts: Breasts are symmetrical.        Right: Normal. No mass, nipple discharge or skin change.        Left: Normal. No mass, nipple discharge or skin change.  Abdominal:     General: Abdomen is flat.     Palpations: Abdomen is soft.      Tenderness: There is no abdominal tenderness.     Comments: Gravid   Genitourinary:    General: Normal vulva.     Exam position: Lithotomy position.     Pubic Area: No rash.      Labia:        Right: No rash.        Left: No rash.      Vagina: Vaginal discharge (scant, white) present.     Cervix: No cervical motion tenderness or friability.     Uterus: Normal. Enlarged (Gravid 15 wk size). Not tender.      Adnexa: Right adnexa normal and left adnexa normal.     Rectum: Normal. No external hemorrhoid.  Musculoskeletal:     Right lower leg: No edema.     Left lower leg: No edema.  Lymphadenopathy:     Upper Body:     Right upper body: No axillary adenopathy.     Left upper body: No axillary adenopathy.  Skin:    General: Skin is warm.     Capillary Refill: Capillary refill takes less than 2 seconds.  Neurological:     Mental Status: She is alert.     Assessment and Plan:  Pregnancy: G3P2002 at [redacted]w[redacted]d  1. Supervision of other normal pregnancy, antepartum -Initial prenatal visit today. Referral placed today for Anatomy US through [facility]. -Pt declines additional genetic screenings.  -Routine dental care recommended during pregnancy, handout of local dentists provided. -Pap will be due in 02/2020 - HIV [place] [facility] - Prenatal profile without Varicella/Rubella ([id number]) - Urine Culture - Chlamydia/GC NAA, Confirmation - Flu Vaccine QUAD 36+ mos IM - WET PREP FOR TRICH, YEAST, CLUE - Hemoglobin, venipuncture - Urinalysis (Urine Dip)  2. Obesity affecting pregnancy in second trimester -Early GTT today, add'l labs as listed. -Healthy weight gain discussed. Pt declines MNT referral. -Pt accepts aspirin at 12 wks, handout given. -May consider serial growth scans starting at 28 wks. - Glucose, 1 hour gestational - Comprehensive metabolic panel - Protein / creatinine ratio, urine  (Spot) - TSH    Discussed overview of care and coordination with inpatient delivery practices  including [facility], [facility], [facility] and [facility].    Preterm labor symptoms and general obstetric precautions including but not limited to vaginal bleeding, contractions, leaking of fluid and fetal movement were reviewed in detail with the patient.  Please refer to After Visit Summary for other counseling recommendations.   Return in about 4 weeks (around 04/05/2019).  Future Appointments  Date Time Provider [place]  04/05/2019  1:20 PM [facility] PROVIDER [facility] None    Kandee Keen, PA-C

## 2019-03-08 NOTE — Progress Notes (Addendum)
Here today for 15.0 week MH IP. Taking PNV QD, denies ED/hospital visits since +PT. Early 1h gtt today. Accepts Flu vaccine. Quad Screen declination form signed. Tawny Hopping, RN

## 2019-03-08 NOTE — Progress Notes (Signed)
Flu vaccine given and tolerated well. Marlissa Emerick, RN ° °

## 2019-03-08 NOTE — Progress Notes (Signed)
[place]et [place] results reviewed. Per standing orders no treatment indicated. Amatullah [name], RN  

## 2019-03-09 LAB — CBC/D/PLT+RPR+RH+ABO+AB SCR
Antibody Screen: NEGATIVE
Basophils Absolute: 0 10*3/uL (ref 0.0–0.2)
Basos: 0 %
EOS (ABSOLUTE): 0 10*3/uL (ref 0.0–0.4)
Eos: 1 %
Hematocrit: 36.3 % (ref 34.0–46.6)
Hemoglobin: 12.3 g/dL (ref 11.1–15.9)
Hepatitis B Surface Ag: NEGATIVE
Immature Grans (Abs): 0 10*3/uL (ref 0.0–0.1)
Immature Granulocytes: 0 %
Lymphocytes Absolute: 1.2 10*3/uL (ref 0.7–3.1)
Lymphs: 22 %
MCH: 29.1 pg (ref 26.6–33.0)
MCHC: 33.9 g/dL (ref 31.5–35.7)
MCV: 86 fL (ref 79–97)
Monocytes Absolute: 0.3 10*3/uL (ref 0.1–0.9)
Monocytes: 6 %
Neutrophils Absolute: 3.6 10*3/uL (ref 1.4–7.0)
Neutrophils: 71 %
Platelets: 302 10*3/uL (ref 150–450)
RBC: 4.22 x10E6/uL (ref 3.77–5.28)
RDW: 13.9 % (ref 11.7–15.4)
RPR Ser Ql: NONREACTIVE
Rh Factor: POSITIVE
WBC: 5.2 10*3/uL (ref 3.4–10.8)

## 2019-03-09 LAB — COMPREHENSIVE METABOLIC PANEL
ALT: 10 IU/L (ref 0–32)
AST: 18 IU/L (ref 0–40)
Albumin/Globulin Ratio: 1.3 (ref 1.2–2.2)
Albumin: 3.7 g/dL — ABNORMAL LOW (ref 3.9–5.0)
Alkaline Phosphatase: 67 IU/L (ref 39–117)
BUN/Creatinine Ratio: 9 (ref 9–23)
BUN: 5 mg/dL — ABNORMAL LOW (ref 6–20)
Bilirubin Total: 0.4 mg/dL (ref 0.0–1.2)
CO2: 22 mmol/L (ref 20–29)
Calcium: 9.1 mg/dL (ref 8.7–10.2)
Chloride: 101 mmol/L (ref 96–106)
Creatinine, Ser: 0.55 mg/dL — ABNORMAL LOW (ref 0.57–1.00)
GFR calc Af Amer: 146 mL/min/{1.73_m2} (ref >59)
GFR calc non Af Amer: 126 mL/min/{1.73_m2} (ref >59)
Globulin, Total: 2.9 g/dL (ref 1.5–4.5)
Glucose: 178 mg/dL — ABNORMAL HIGH (ref 65–99)
Potassium: 4.1 mmol/L (ref 3.5–5.2)
Sodium: 134 mmol/L (ref 134–144)
Total Protein: 6.6 g/dL (ref 6.0–8.5)

## 2019-03-09 LAB — GLUCOSE, 1 HOUR GESTATIONAL: Gestational Diabetes Screen: 169 mg/dL — ABNORMAL HIGH (ref 65–139)

## 2019-03-09 LAB — PROTEIN / CREATININE RATIO, URINE
Creatinine, Urine: 146.6 mg/dL
Protein, Ur: 11.7 mg/dL
Protein/Creat Ratio: 80 mg/g creat (ref 0–200)

## 2019-03-09 LAB — TSH: TSH: 1.9 u[IU]/mL (ref 0.450–4.500)

## 2019-03-10 ENCOUNTER — Telehealth: Payer: Self-pay

## 2019-03-10 ENCOUNTER — Encounter: Payer: Self-pay | Admitting: Family Medicine

## 2019-03-10 DIAGNOSIS — O99810 Abnormal glucose complicating pregnancy: Secondary | ICD-10-CM | POA: Insufficient documentation

## 2019-03-10 LAB — URINE CULTURE

## 2019-03-10 NOTE — Telephone Encounter (Signed)
Attemped to call via interpreter V. Olmedo-no answer, no voicemail Sharlette Dense, RN

## 2019-03-10 NOTE — Telephone Encounter (Signed)
Via interpreter M. [place]-attempted to call to schedule 3 Hr Gtt; no answer & voicemail not set up Sharlette Dense, RN

## 2019-03-10 NOTE — Telephone Encounter (Signed)
Returned call; via interpreter V. Olmedo discussed abnl 1 hr and need for 3 Hr Gtt-scheduled appt 03/15/19 and instructions given Sharlette Dense, RN

## 2019-03-11 ENCOUNTER — Encounter: Payer: Self-pay | Admitting: Family Medicine

## 2019-03-11 DIAGNOSIS — O0932 Supervision of pregnancy with insufficient antenatal care, second trimester: Secondary | ICD-10-CM | POA: Insufficient documentation

## 2019-03-11 DIAGNOSIS — O0930 Supervision of pregnancy with insufficient antenatal care, unspecified trimester: Secondary | ICD-10-CM | POA: Insufficient documentation

## 2019-03-11 LAB — CHLAMYDIA/GC NAA, CONFIRMATION
Chlamydia trachomatis, NAA: NEGATIVE
Neisseria gonorrhoeae, NAA: NEGATIVE

## 2019-03-15 ENCOUNTER — Other Ambulatory Visit: Payer: Medicaid Other

## 2019-03-15 ENCOUNTER — Other Ambulatory Visit: Payer: Self-pay

## 2019-03-15 DIAGNOSIS — Z3480 Encounter for supervision of other normal pregnancy, unspecified trimester: Secondary | ICD-10-CM | POA: Diagnosis not present

## 2019-03-15 NOTE — Addendum Note (Signed)
Addended by: Richmond Campbell on: 03/15/2019 03:48 PM   Modules accepted: Orders

## 2019-03-15 NOTE — Progress Notes (Signed)
Patient in nurse clinic today for 3 hour GTT. Instructions given, patient sent to lab. No history of travel noted. Alaa Mullally, RN   

## 2019-03-15 NOTE — Progress Notes (Signed)
HIV results abstracted. Clark Clowdus, RN  

## 2019-03-16 ENCOUNTER — Telehealth: Payer: Self-pay | Admitting: Family Medicine

## 2019-03-16 DIAGNOSIS — O99810 Abnormal glucose complicating pregnancy: Secondary | ICD-10-CM

## 2019-03-16 LAB — GLUCOSE TOLERANCE TEST, 6 HOUR
Glucose, 1 Hour GTT: 182 mg/dL (ref 65–199)
Glucose, 2 hour: 143 mg/dL — ABNORMAL HIGH (ref 65–139)
Glucose, 3 hour: 117 mg/dL — ABNORMAL HIGH (ref 65–109)
Glucose, GTT - Fasting: 76 mg/dL (ref 65–99)

## 2019-03-16 NOTE — Telephone Encounter (Signed)
Spoke w/pt about 3 hr GTT results - equivocal. She accepts referral to MNT and knows she will need repeat 3 hr GTT at 28 wks.

## 2019-04-04 ENCOUNTER — Telehealth: Payer: Self-pay

## 2019-04-04 NOTE — Telephone Encounter (Signed)
Patient called to change MH RV appointment due to conflict in personal schedule. Patient rescheduled for 04/07/19. Patient wanting to know if she can still have the "blood test" that day and when her U/S will be. After clarification about "blood test", patient reassured that she may still have the blood test on Thursday if she desires to do so. Patient has previously declined Scientist, forensic. Patient counseled that she and provider can discuss u/s at her appointment later this week. Patient states understanding. Interpreter, M. [place].Burt Knack, RN

## 2019-04-05 ENCOUNTER — Ambulatory Visit: Payer: Self-pay

## 2019-04-07 ENCOUNTER — Ambulatory Visit: Payer: Medicaid Other | Admitting: Physician Assistant

## 2019-04-07 ENCOUNTER — Other Ambulatory Visit: Payer: Self-pay

## 2019-04-07 ENCOUNTER — Encounter: Payer: Self-pay | Admitting: Physician Assistant

## 2019-04-07 VITALS — BP 89/58 | HR 62 | Temp 97.3°F | Wt 173.4 lb

## 2019-04-07 DIAGNOSIS — O99810 Abnormal glucose complicating pregnancy: Secondary | ICD-10-CM

## 2019-04-07 DIAGNOSIS — Z3480 Encounter for supervision of other normal pregnancy, unspecified trimester: Secondary | ICD-10-CM

## 2019-04-07 NOTE — Progress Notes (Signed)
   PRENATAL VISIT NOTE  Subjective:  Sierra Reynolds is a 29 y.o. G3P2002 at [redacted]w[redacted]d being seen today for ongoing prenatal care.  She is currently monitored for the following issues for this low-risk pregnancy and has History of abnormal cervical Pap smear; Supervision of other normal pregnancy, antepartum; Obesity affecting pregnancy in second trimester; Abnormal glucose affecting pregnancy: 3 hr results equivocal; and Late prenatal care, antepartum on their problem list.  Patient reports no complaints.  Contractions: Not present. Vag. Bleeding: None.  Movement: Present. Denies leaking of fluid/ROM.   The following portions of the patient's history were reviewed and updated as appropriate: allergies, current medications, past family history, past medical history, past social history, past surgical history and problem list. Problem list updated.  Objective:   Vitals:   04/07/19 1314  BP: (!) 89/58  Pulse: 62  Temp: (!) 97.3 F (36.3 C)  Weight: 173 lb 6.4 oz (78.7 kg)    Fetal Status: Fetal Heart Rate (bpm): 132 Fundal Height: 20 cm Movement: Present     General:  Alert, oriented and cooperative. Patient is in no acute distress.  Skin: Skin is warm and dry. No rash noted.   Cardiovascular: Normal heart rate noted  Respiratory: Normal respiratory effort, no problems with respiration noted  Abdomen: Soft, gravid, appropriate for gestational age.  Pain/Pressure: Absent     Pelvic: Cervical exam deferred        Extremities: Normal range of motion.  Edema: None  Mental Status: Normal mood and affect. Normal behavior. Normal judgment and thought content.   Assessment and Plan:  Pregnancy: G3P2002 at [redacted]w[redacted]d  1. Supervision of other normal pregnancy, antepartum Schedule routine fetal anat scan. Pt has changed her mind about the quad screen, would now like to have that done. Blood to be drawn today.  2. Abnormal glucose affecting pregnancy: 3 hr results equivocal Counseled re:  equivocal 3h OGTT. MNT consult pending. Anticipatory guidance re: repeat 3h OGTT at 28-wk visit.   Preterm   labor symptoms and general obstetric precautions including but not limited to vaginal bleeding, contractions, leaking of fluid and fetal movement were reviewed in detail with the patient. Please refer to After Visit Summary for other counseling recommendations.  Return in about 4 weeks (around 05/05/2019) for Routine prenatal care.  Future Appointments  Date Time Provider Department Center  04/07/2019  1:40 PM Elfego Giammarino, Mathis Dad, PA-C [facility] None    Landry Dyke, [place]

## 2019-04-07 NOTE — Progress Notes (Signed)
In for visit; taking PNV; has not had MNT scheduled due to Equivocal 3 Hr; denies hospital visits since last appt Sharlette Dense, RN

## 2019-04-11 ENCOUNTER — Other Ambulatory Visit: Payer: Self-pay | Admitting: Physician Assistant

## 2019-04-11 DIAGNOSIS — Z3689 Encounter for other specified antenatal screening: Secondary | ICD-10-CM

## 2019-04-14 ENCOUNTER — Telehealth: Payer: Self-pay

## 2019-04-14 NOTE — Telephone Encounter (Signed)
Patient returned phone call. Informed of scheduled DP appt date and time. Also instructed to set up voicemail on phone. Juliene Pina provided assistance with interpretation. Tawny Hopping, RN

## 2019-04-14 NOTE — Telephone Encounter (Signed)
Phone call to patient with assistance of V. Olmedo to inform of scheduled DP [place] appt for 04/18/19 @ 0900. No answer and no voicemail set up. ATC to emergency contact (sister,) invalid number. Tawny Hopping, RN

## 2019-04-18 ENCOUNTER — Ambulatory Visit
Admission: RE | Admit: 2019-04-18 | Discharge: 2019-04-18 | Disposition: A | Discharge status: Home / Self Care | Payer: Self-pay | Source: Ambulatory Visit | Attending: Obstetrics and Gynecology | Admitting: Obstetrics and Gynecology

## 2019-04-18 ENCOUNTER — Other Ambulatory Visit: Payer: Self-pay

## 2019-04-18 DIAGNOSIS — Z3689 Encounter for other specified antenatal screening: Secondary | ICD-10-CM | POA: Insufficient documentation

## 2019-04-18 DIAGNOSIS — O0930 Supervision of pregnancy with insufficient antenatal care, unspecified trimester: Secondary | ICD-10-CM

## 2019-04-18 DIAGNOSIS — Z3A20 20 weeks gestation of pregnancy: Secondary | ICD-10-CM | POA: Insufficient documentation

## 2019-04-26 ENCOUNTER — Telehealth: Payer: Self-pay | Admitting: Family Medicine

## 2019-04-26 NOTE — Telephone Encounter (Signed)
Patient wants to be seen by a dentist, she is having some pain in her molars. She doesn't have insurance only presumptive until 05/06/2019. She would like to know if there is anywhere she can go that she doesn't have to pay much.

## 2019-04-26 NOTE — Telephone Encounter (Signed)
Returned client's call-c/o pain in tooth x 3 days & requesting dental office #; #'s given for [facility], [place], & [facility] dental clinics;  If referral is needed, to call back; to call with further problems, ?'s Sharlette Dense, RN Interpreter#[phone]

## 2019-05-05 ENCOUNTER — Other Ambulatory Visit: Payer: Self-pay

## 2019-05-05 ENCOUNTER — Encounter: Payer: Self-pay | Admitting: Physician Assistant

## 2019-05-05 ENCOUNTER — Ambulatory Visit: Payer: Medicaid Other | Admitting: Physician Assistant

## 2019-05-05 VITALS — BP 86/56 | HR 75 | Temp 97.3°F | Wt 174.0 lb

## 2019-05-05 DIAGNOSIS — O0930 Supervision of pregnancy with insufficient antenatal care, unspecified trimester: Secondary | ICD-10-CM

## 2019-05-05 DIAGNOSIS — Z3480 Encounter for supervision of other normal pregnancy, unspecified trimester: Secondary | ICD-10-CM | POA: Diagnosis not present

## 2019-05-05 DIAGNOSIS — K047 Periapical abscess without sinus: Secondary | ICD-10-CM

## 2019-05-05 MED ORDER — PRENATAL VITAMINS 28-0.8 MG PO TABS: 1.0000 | ORAL_TABLET | Freq: Every day | ORAL | 0 refills | Status: DC

## 2019-05-05 NOTE — Progress Notes (Signed)
   PRENATAL VISIT NOTE  Subjective:  Sierra Reynolds is a 29 y.o. G3P2002 at [redacted]w[redacted]d being seen today for ongoing prenatal care.  She is currently monitored for the following issues for this low-risk pregnancy and has History of abnormal cervical Pap smear; Supervision of other normal pregnancy, antepartum; Abnormal glucose affecting pregnancy: 3 hr results equivocal; Late prenatal care, antepartum; and Dental abscess on their problem list.  Patient reports tooth pain. Had dental eval 2 days ago and was told she has at least one tooth abscess. Was placed on oral amoxicillin with plan to do extraction after acute infection resolved. Taking po acetaminophen 4 g daily in 4 1000mg  doses, not full relief of tooth pain. .  Contractions: Not present. Vag. Bleeding: None.  Movement: Present. Denies leaking of fluid/ROM.   The following portions of the patient's history were reviewed and updated as appropriate: allergies, current medications, past family history, past medical history, past social history, past surgical history and problem list. Problem list updated.  Objective:   Vitals:   05/05/19 0958  BP: (!) 86/56  Pulse: 75  Temp: (!) 97.3 F (36.3 C)  Weight: 174 lb (78.9 kg)    Fetal Status: Fetal Heart Rate (bpm): 128 Fundal Height: 26 cm Movement: Present     General:  Alert, oriented and cooperative. Patient is in no acute distress.  Skin: Skin is warm and dry. No rash noted.   Cardiovascular: Normal heart rate noted  Respiratory: Normal respiratory effort, no problems with respiration noted  Abdomen: Soft, gravid, appropriate for gestational age.  Pain/Pressure: Absent     Pelvic: Cervical exam deferred        Extremities: Normal range of motion.  Edema: None  Mental Status: Normal mood and affect. Normal behavior. Normal judgment and thought content.   Assessment and Plan:  Pregnancy: G3P2002 at [redacted]w[redacted]d  1. Late prenatal care, antepartum - Prenatal Vit-Fe Fumarate-FA (PRENATAL  VITAMINS) 28-0.8 MG TABS; Take 1 tablet by mouth daily for 100 doses.  Dispense: 100 tablet; Refill: 0  2. Supervision of other normal pregnancy, antepartum Anticipatory guidance re: 28-wk labs at RV, including 3h OGTT due to prior equivocal. - Prenatal Vit-Fe Fumarate-FA (PRENATAL VITAMINS) 28-0.8 MG TABS; Take 1 tablet by mouth daily for 100 doses.  Dispense: 100 tablet; Refill: 0  3. Dental abscess Enc to complete amox antibiotic course as rx by dentist. Reviewed safe acetaminophen dosing for pain, to add local ice. F/u with dentist as scheduled.   Preterm labor symptoms and general obstetric precautions including but not limited to vaginal bleeding, contractions, leaking of fluid and fetal movement were reviewed in detail with the patient. Please refer to After Visit Summary for other counseling recommendations.  Return in about 4 weeks (around 06/02/2019) for Routine prenatal care, 28 wk labs.  Future Appointments  Date Time Provider Department Center  06/02/2019  9:00 AM [facility] PROVIDER [facility] None    06/04/2019, PA-C

## 2019-05-05 NOTE — Progress Notes (Addendum)
Here today for 23.2 week MH RV. Taking PNV QD. Went to the dentist 05/03/2019 for " a molar abcess." States she is taking an antibiotic and Tylenol for abcess but Tylenol is not helping with pain. Denies any other ED/hospital or MD visits since last RV. PTL and CCNC, PHQ9 forms today. Tawny Hopping, RN

## 2019-05-18 ENCOUNTER — Other Ambulatory Visit: Payer: Self-pay | Admitting: Family Medicine

## 2019-05-18 DIAGNOSIS — O99810 Abnormal glucose complicating pregnancy: Secondary | ICD-10-CM

## 2019-06-02 ENCOUNTER — Ambulatory Visit: Payer: Self-pay | Admitting: Physician Assistant

## 2019-06-02 ENCOUNTER — Encounter: Payer: Self-pay | Admitting: Physician Assistant

## 2019-06-02 ENCOUNTER — Other Ambulatory Visit: Payer: Self-pay

## 2019-06-02 VITALS — BP 94/59 | HR 64 | Temp 97.5°F | Wt 178.2 lb

## 2019-06-02 DIAGNOSIS — Z3480 Encounter for supervision of other normal pregnancy, unspecified trimester: Secondary | ICD-10-CM

## 2019-06-02 DIAGNOSIS — O24419 Gestational diabetes mellitus in pregnancy, unspecified control: Secondary | ICD-10-CM

## 2019-06-02 DIAGNOSIS — O99810 Abnormal glucose complicating pregnancy: Secondary | ICD-10-CM

## 2019-06-02 LAB — OB RESULTS CONSOLE HIV ANTIBODY (ROUTINE TESTING): HIV: NONREACTIVE

## 2019-06-02 LAB — HEMOGLOBIN, FINGERSTICK: Hemoglobin: 11.3 g/dL (ref 11.1–15.9)

## 2019-06-02 NOTE — Progress Notes (Signed)
Patient here today for 3 hour gtt and MH RV at 27 2/7. Patient states she has not eaten/drunk since last night at 7pm. Patient sent to lab for blood draw and glucose drink and counseled to come back to clinic for RV after lab. Patient counseled she will need to stay in the waiting area between her hourly blood draws, and that she will need to wait to eat/drink until after the final blood draw. Patient states understanding.Burt Knack, RN

## 2019-06-02 NOTE — Progress Notes (Signed)
Patient counseled to set up VM so she can receive messages. Patient states she has not heard from dietician yet. Patient needs to be to lab for next blood draw at 9:20am..Burt Knack, RN

## 2019-06-02 NOTE — Progress Notes (Signed)
   PRENATAL VISIT NOTE  Subjective:  Sierra Reynolds is a 29 y.o. G3P2002 at [redacted]w[redacted]d being seen today for ongoing prenatal care.  She is currently monitored for the following issues for this high-risk pregnancy and has History of abnormal cervical Pap smear; Supervision of other normal pregnancy, antepartum; Abnormal glucose affecting pregnancy: 3 hr results equivocal; Late prenatal care, antepartum; and Dental abscess on their problem list.  Patient reports no complaints.  Contractions: Not present. Vag. Bleeding: None.  Movement: Present. Denies leaking of fluid/ROM.   The following portions of the patient's history were reviewed and updated as appropriate: allergies, current medications, past family history, past medical history, past social history, past surgical history and problem list. Problem list updated.  Objective:   Vitals:   06/02/19 0828  BP: (!) 94/59  Pulse: 64  Temp: (!) 97.5 F (36.4 C)  Weight: 178 lb 3.2 oz (80.8 kg)    Fetal Status: Fetal Heart Rate (bpm): 126 Fundal Height: 30 cm Movement: Present     General:  Alert, oriented and cooperative. Patient is in no acute distress.  Skin: Skin is warm and dry. No rash noted.   Cardiovascular: Normal heart rate noted  Respiratory: Normal respiratory effort, no problems with respiration noted  Abdomen: Soft, gravid, appropriate for gestational age.  Pain/Pressure: Absent     Pelvic: Cervical exam deferred        Extremities: Normal range of motion.  Edema: None  Mental Status: Normal mood and affect. Normal behavior. Normal judgment and thought content.   Assessment and Plan:  Pregnancy: G3P2002 at [redacted]w[redacted]d  1. Supervision of other normal pregnancy, antepartum Anticipatory guidance re: transition to every 2-wk visits. - Glucose Tolerance Test, 6 Hour - HIV [place] [facility] - [facility]PR - Tdap vaccine greater than or equal to 7yo IM - Hemoglobin, fingerstick  2. Abnormal glucose affecting pregnancy Will notify pt if  results are not normal. Enc nutrition visit. - Glucose Tolerance Test, 6 Hour   Preterm labor symptoms and general obstetric precautions including but not limited to vaginal bleeding, contractions, leaking of fluid and fetal movement were reviewed in detail with the patient. Please refer to After Visit Summary for other counseling recommendations.  Return in about 2 weeks (around 06/16/2019) for Routine prenatal care.  Future Appointments  Date Time Provider Department Center  06/02/2019  9:00 AM [place], Mathis Dad, PA-C [facility] None    Landry Dyke, [place]

## 2019-06-02 NOTE — Progress Notes (Signed)
Tdap given, right deltoid, tolerated well, VIS given. NCIR given to patient. Patient counseled to relax in waiting area until next 2 blood draws done. Patient states understanding.Burt Knack, RN

## 2019-06-03 ENCOUNTER — Encounter: Payer: Self-pay | Admitting: Family Medicine

## 2019-06-03 ENCOUNTER — Other Ambulatory Visit: Payer: Self-pay | Admitting: Family Medicine

## 2019-06-03 DIAGNOSIS — O24419 Gestational diabetes mellitus in pregnancy, unspecified control: Secondary | ICD-10-CM | POA: Insufficient documentation

## 2019-06-03 DIAGNOSIS — O0990 Supervision of high risk pregnancy, unspecified, unspecified trimester: Secondary | ICD-10-CM

## 2019-06-03 LAB — GLUCOSE TOLERANCE TEST, 6 HOUR
Glucose, 1 Hour GTT: 211 mg/dL — ABNORMAL HIGH (ref 65–199)
Glucose, 2 hour: 154 mg/dL — ABNORMAL HIGH (ref 65–139)
Glucose, 3 hour: 101 mg/dL (ref 65–109)
Glucose, GTT - Fasting: 64 mg/dL — ABNORMAL LOW (ref 65–99)

## 2019-06-03 LAB — RPR: RPR Ser Ql: NONREACTIVE

## 2019-06-03 MED ORDER — ACCU-CHEK NANO SMARTVIEW W/DEVICE KIT: 1.0000 | PACK | 0 refills | Status: DC

## 2019-06-03 MED ORDER — ACCU-CHEK SMARTVIEW VI STRP: ORAL_STRIP | 5 refills | Status: DC

## 2019-06-03 MED ORDER — ACCU-CHEK SOFTCLIX LANCETS MISC: 1.0000 | Freq: Four times a day (QID) | 5 refills | Status: DC

## 2019-06-03 NOTE — Addendum Note (Signed)
Addended by: Geanie Berlin on: 06/03/2019 10:30 AM   Modules accepted: Orders

## 2019-06-07 ENCOUNTER — Telehealth: Payer: Self-pay

## 2019-06-07 NOTE — Telephone Encounter (Signed)
Client returned call-informed of above & to receive call from [facility] re: GDM referral Sharlette Dense, RN

## 2019-06-07 NOTE — Telephone Encounter (Signed)
Attempted to call via interpreter V. Olmedo; to inform of GDM dg and will receive [facility] appt and to pick up scripts/lancets after meets with [facility].  To bring BS log to appts; no answer & voicemail not set up Sharlette Dense, RN

## 2019-06-10 NOTE — Progress Notes (Signed)
HIV results for 06/02/2019 abstracted. Tawny Hopping, RN

## 2019-06-16 ENCOUNTER — Encounter: Payer: Self-pay | Admitting: Physician Assistant

## 2019-06-16 ENCOUNTER — Ambulatory Visit: Payer: Self-pay | Admitting: Physician Assistant

## 2019-06-16 ENCOUNTER — Other Ambulatory Visit: Payer: Self-pay

## 2019-06-16 DIAGNOSIS — O0990 Supervision of high risk pregnancy, unspecified, unspecified trimester: Secondary | ICD-10-CM

## 2019-06-16 DIAGNOSIS — O24419 Gestational diabetes mellitus in pregnancy, unspecified control: Secondary | ICD-10-CM

## 2019-06-16 LAB — URINALYSIS
Bilirubin, UA: NEGATIVE
Glucose, UA: NEGATIVE
Nitrite, UA: NEGATIVE
RBC, UA: NEGATIVE
Specific Gravity, UA: 1.02 (ref 1.005–1.030)
Urobilinogen, Ur: 1 mg/dL (ref 0.2–1.0)
pH, UA: 7 (ref 5.0–7.5)

## 2019-06-16 NOTE — Progress Notes (Signed)
In for visit; taking PNV; denies hospital visits since last appt; has [facility] appt 06/17/19 due to GDM Sharlette Dense, RN

## 2019-06-16 NOTE — Progress Notes (Signed)
   PRENATAL VISIT NOTE  Subjective:  Sierra Reynolds is a 29 y.o. G3P2002 at [redacted]w[redacted]d being seen today for ongoing prenatal care.  She is currently monitored for the following issues for this high-risk pregnancy and has History of abnormal cervical Pap smear; Supervision of high risk pregnancy, antepartum; Abnormal glucose affecting pregnancy: 3 hr results equivocal; Late prenatal care, antepartum; Dental abscess; and Gestational diabetes mellitus (GDM) affecting pregnancy, antepartum on their problem list.  Patient reports she feels well. Has started to modify her diet per recommendations of GDM, also walking more..  Contractions: Not present. Vag. Bleeding: None.  Movement: Present. Denies leaking of fluid/ROM.   The following portions of the patient's history were reviewed and updated as appropriate: allergies, current medications, past family history, past medical history, past social history, past surgical history and problem list. Problem list updated.  Objective:   Vitals:   06/16/19 1006  BP: (!) 92/56  Pulse: 76  Temp: (!) 97.1 F (36.2 C)  Weight: 177 lb (80.3 kg)    Fetal Status: Fetal Heart Rate (bpm): 132 Fundal Height: 30 cm Movement: Present     General:  Alert, oriented and cooperative. Patient is in no acute distress.  Skin: Skin is warm and dry. No rash noted.   Cardiovascular: Normal heart rate noted  Respiratory: Normal respiratory effort, no problems with respiration noted  Abdomen: Soft, gravid, appropriate for gestational age.  Pain/Pressure: Absent     Pelvic: Cervical exam deferred        Extremities: Normal range of motion.  Edema: None  Mental Status: Normal mood and affect. Normal behavior. Normal judgment and thought content.   Assessment and Plan:  Pregnancy: G3P2002 at [redacted]w[redacted]d  1. Supervision of high risk pregnancy, antepartum Anticipatory guidance re: q 2-week visits til 36 wk. Fundal height reassuring. - Urinalysis (Urine Dip)  2. Gestational  diabetes mellitus (GDM) affecting pregnancy, antepartum Enc to keep [facility] appt for GDM counseling/start home BS readings. Praised diet/exercise.   Preterm labor symptoms and general obstetric precautions including but not limited to vaginal bleeding, contractions, leaking of fluid and fetal movement were reviewed in detail with the patient. Please refer to After Visit Summary for other counseling recommendations.  Return in about 2 weeks (around 06/30/2019) for Routine prenatal care.  Future Appointments  Date Time Provider Department Center  06/17/2019  9:00 AM [place], Orlean Bradford, RN [facility] None    Landry Dyke, PA-C

## 2019-06-17 ENCOUNTER — Encounter: Payer: Self-pay | Admitting: *Deleted

## 2019-06-17 ENCOUNTER — Encounter: Payer: Self-pay | Attending: Family Medicine | Admitting: *Deleted

## 2019-06-17 VITALS — BP 90/56 | Ht 60.0 in | Wt 177.8 lb

## 2019-06-17 DIAGNOSIS — Z3A27 27 weeks gestation of pregnancy: Secondary | ICD-10-CM | POA: Insufficient documentation

## 2019-06-17 DIAGNOSIS — O24419 Gestational diabetes mellitus in pregnancy, unspecified control: Secondary | ICD-10-CM | POA: Insufficient documentation

## 2019-06-17 DIAGNOSIS — O24410 Gestational diabetes mellitus in pregnancy, diet controlled: Secondary | ICD-10-CM

## 2019-06-17 DIAGNOSIS — Z713 Dietary counseling and surveillance: Secondary | ICD-10-CM | POA: Insufficient documentation

## 2019-06-17 NOTE — Patient Instructions (Addendum)
Read booklet on Gestational Diabetes Follow Gestational Meal Planning Guidelines Include 1 serving of protein with each meal and snacks Avoid fruit at breakfast if blood sugars elevated Check blood sugars 4 x day - before breakfast and 2 hrs after every meal and record  Bring blood sugar log to all appointments Call MD for prescription for meter strips and lancets Strips  Accu-Chek Guide  Lancets   Accu-Chek Softclix Purchase urine ketone strips if instructed by MD and check urine ketones every am:  If + increase bedtime snack to 1 protein and 2 carbohydrate servings Walk 20-30 minutes at least 5 x week if permitted by MD

## 2019-06-17 NOTE — Progress Notes (Signed)
Diabetes Self-Management Education  Visit Type: First/Initial  Appt. Start Time: 0910 Appt. End Time: 1105  06/17/2019  Ms. Sierra Reynolds, identified by name and date of birth, is a 29 y.o. female with a diagnosis of Diabetes: Gestational Diabetes.   ASSESSMENT  Blood pressure (!) 90/56, height 5' (1.524 m), weight 177 lb 12.8 oz (80.6 kg), last menstrual period 11/23/2018, estimated date of delivery 08/30/2019 Body mass index is 34.72 kg/m.   Diabetes Self-Management Education - 06/17/19 1033      Visit Information   Visit Type First/Initial      Initial Visit   Diabetes Type Gestational Diabetes    Are you currently following a meal plan? Yes    What type of meal plan do you follow? "controlling amount of food"    Are you taking your medications as prescribed? Yes    Date Diagnosed June 2021      Health Coping   How would you rate your overall health? Fair      Psychosocial Assessment   Patient Belief/Attitude about Diabetes Other (comment)   "worry"   Self-care barriers English as a second language    Self-management support Doctor's office;Family    Other persons present Interpreter   Milly - interpreter   Patient Concerns Nutrition/Meal planning;Glycemic Control;Monitoring;Healthy Lifestyle    Special Needs Other (comment)   Materials in Spanish   Preferred Learning Style Visual    Learning Readiness Change in progress    How often do you need to have someone help you when you read instructions, pamphlets, or other written materials from your doctor or pharmacy? 1 - Never   if written in spanish   What is the last grade level you completed in school? [employer] in [place]      Pre-Education Assessment   Patient understands the diabetes disease and treatment process. Needs Instruction    Patient understands incorporating nutritional management into lifestyle. Needs Instruction    Patient undertands incorporating physical activity into lifestyle. Needs Review     Patient understands using medications safely. Needs Instruction    Patient understands monitoring blood glucose, interpreting and using results Needs Instruction    Patient understands prevention, detection, and treatment of acute complications. Needs Instruction    Patient understands prevention, detection, and treatment of chronic complications. Needs Instruction    Patient understands how to develop strategies to address psychosocial issues. Needs Instruction    Patient understands how to develop strategies to promote health/change behavior. Needs Instruction      Complications   How often do you check your blood sugar? 0 times/day (not testing)   Provided Accu-Chek Guide Me meter and instructed on use. BG upon return demonstration was 72 mg/dL at 44:01 am - 2 1/2 hrs pp.   Have you had a dilated eye exam in the past 12 months? No    Have you had a dental exam in the past 12 months? Yes    Are you checking your feet? Yes    How many days per week are you checking your feet? 2      Dietary Intake   Breakfast oatmeal and fruit - apple berries, pineapple, banana, pear    Lunch corn tortilla, egg, stew with chicken, beans, rice, potatoes, quesadilaa with cheese and mushrooms, broccoli, cauliflower, carrots, tomatoes, spaghetti, chayote    Dinner same as lunch    Snack (evening) oatmeal and fruit    Beverage(s) water      Exercise   Exercise Type Light (  walking / raking leaves)    How many days per week to you exercise? 5    How many minutes per day do you exercise? 30    Total minutes per week of exercise 150      Patient Education   Previous Diabetes Education Yes (please comment)   in [place] with her 1st pregnancy   Disease state  Definition of diabetes, type 1 and 2, and the diagnosis of diabetes;Factors that contribute to the development of diabetes    Nutrition management  Role of diet in the treatment of diabetes and the relationship between the three main macronutrients and  blood glucose level;Food label reading, portion sizes and measuring food.;Reviewed blood glucose goals for pre and post meals and how to evaluate the patients' food intake on their blood glucose level.    Physical activity and exercise  Role of exercise on diabetes management, blood pressure control and cardiac health.    Medications Other (comment)   Limited use of oral medications during pregnancy and possibility of insulin.   Monitoring Taught/evaluated SMBG meter.;Purpose and frequency of SMBG.;Taught/discussed recording of test results and interpretation of SMBG.;Ketone testing, when, how.    Chronic complications Relationship between chronic complications and blood glucose control    Psychosocial adjustment Identified and addressed patients feelings and concerns about diabetes    Preconception care Pregnancy and GDM  Role of pre-pregnancy blood glucose control on the development of the fetus;Reviewed with patient blood glucose goals with pregnancy;Role of family planning for patients with diabetes      Individualized Goals (developed by patient)   Reducing Risk Other (comment)   improve blood sugars, prevent diabetes complications, lead a healthier lifestyle     Outcomes   Expected Outcomes Demonstrated interest in learning. Expect positive outcomes    Future DMSE PRN    Program Status Not Completed           Individualized Plan for Diabetes Self-Management Training:   Learning Objective:  Patient will have a greater understanding of diabetes self-management. Patient education plan is to attend individual and/or group sessions per assessed needs and concerns.   Plan:   Patient Instructions  Read booklet on Gestational Diabetes Follow Gestational Meal Planning Guidelines Include 1 serving of protein with each meal and snacks Avoid fruit at breakfast if blood sugars elevated Check blood sugars 4 x day - before breakfast and 2 hrs after every meal and record  Bring blood sugar log  to all appointments Call MD for prescription for meter strips and lancets Strips  Accu-Chek Guide  Lancets   Accu-Chek Softclix Purchase urine ketone strips if instructed by MD and check urine ketones every am:  If + increase bedtime snack to 1 protein and 2 carbohydrate servings Walk 20-30 minutes at least 5 x week if permitted by MD  Expected Outcomes:  Demonstrated interest in learning. Expect positive outcomes  Education material provided:  Gestational Booklet Gestational Meal Planning Guidelines Simple Meal Plan Viewed Gestational Diabetes Video Meter = Accu-Chek Guide Me Goals for a Healthy Pregnancy  If problems or questions, patient to contact team via:  Johny Drilling, RN, CCM, [place] [phone]  Future DSME appointment: PRN  The patient doesn't have insurance and doesn't want to schedule an appointment with the dietitian at this time.

## 2019-06-30 ENCOUNTER — Ambulatory Visit: Payer: Self-pay | Admitting: Physician Assistant

## 2019-06-30 ENCOUNTER — Encounter: Payer: Self-pay | Admitting: Physician Assistant

## 2019-06-30 DIAGNOSIS — O24419 Gestational diabetes mellitus in pregnancy, unspecified control: Secondary | ICD-10-CM

## 2019-06-30 DIAGNOSIS — O0990 Supervision of high risk pregnancy, unspecified, unspecified trimester: Secondary | ICD-10-CM

## 2019-06-30 LAB — URINALYSIS
Bilirubin, UA: NEGATIVE
Glucose, UA: NEGATIVE
Ketones, UA: NEGATIVE
Nitrite, UA: NEGATIVE
Protein,UA: NEGATIVE
RBC, UA: NEGATIVE
Specific Gravity, UA: 1.015 (ref 1.005–1.030)
Urobilinogen, Ur: 0.2 mg/dL (ref 0.2–1.0)
pH, UA: 6.5 (ref 5.0–7.5)

## 2019-06-30 LAB — WET PREP FOR TRICH, YEAST, CLUE
Trichomonas Exam: NEGATIVE
Yeast Exam: NEGATIVE

## 2019-06-30 NOTE — Progress Notes (Signed)
In for visit; BS log copied; denies hospital visits since last appt; taking PNV Sharlette Dense, RN [employer] prep reviewed-no Tx indicated Sharlette Dense, RN

## 2019-06-30 NOTE — Progress Notes (Signed)
   PRENATAL VISIT NOTE  Subjective:  Sierra Reynolds is a 29 y.o. G3P2002 at [redacted]w[redacted]d being seen today for ongoing prenatal care.  She is currently monitored for the following issues for this high-risk pregnancy and has History of abnormal cervical Pap smear; Supervision of high risk pregnancy, antepartum; Abnormal glucose affecting pregnancy: 3 hr results equivocal; Late prenatal care, antepartum; Dental abscess; and Gestational diabetes mellitus (GDM) affecting pregnancy, antepartum on their problem list.  Patient reports 1 mo h/o vaginal odor, worse in the morning. No vag discharge, itching or genital rash.  Contractions: Not present. Vag. Bleeding: None.  Movement: Present. Denies leaking of fluid/ROM.   The following portions of the patient's history were reviewed and updated as appropriate: allergies, current medications, past family history, past medical history, past social history, past surgical history and problem list. Problem list updated.  Objective:   Vitals:   06/30/19 1016  BP: (!) 87/57  Pulse: 84  Temp: 97.6 F (36.4 C)  Weight: 176 lb (79.8 kg)    Fetal Status: Fetal Heart Rate (bpm): 123 Fundal Height: 32 cm Movement: Present     General:  Alert, oriented and cooperative. Patient is in no acute distress.  Skin: Skin is warm and dry. No rash noted.   Cardiovascular: Normal heart rate noted  Respiratory: Normal respiratory effort, no problems with respiration noted  Abdomen: Soft, gravid, appropriate for gestational age.  Pain/Pressure: Absent     Pelvic: Cervical exam deferred        Extremities: Normal range of motion.  Edema: None  Mental Status: Normal mood and affect. Normal behavior. Normal judgment and thought content.  Vaginal exam with scant creamy white discharge, no erythema or lesion. Vag pH <4.5. Assessment and Plan:  Pregnancy: G3P2002 at [redacted]w[redacted]d  1. Gestational diabetes mellitus (GDM) affecting pregnancy, antepartum Reviewed BS log (All FBS under  95, all 2h PP under 120). Excellent BS control with diet, to continue this an home BS monitoring. - Urinalysis (Urine Dip)  2. Supervision of high risk pregnancy, antepartum Unclear etiology of vaginal odor. Wet prep clear, exam unremarkable. Watchful waiting, and if sx worsen or persist, consider GC/Chlam testing. Continue routine prenatal care. - WET PREP FOR TRICH, YEAST, CLUE   Preterm labor symptoms and general obstetric precautions including but not limited to vaginal bleeding, contractions, leaking of fluid and fetal movement were reviewed in detail with the patient. Please refer to After Visit Summary for other counseling recommendations.  Return in about 2 weeks (around 07/14/2019) for Routine prenatal care.  No future appointments.  Landry Dyke, PA-C

## 2019-07-15 ENCOUNTER — Ambulatory Visit: Payer: Self-pay

## 2019-07-19 ENCOUNTER — Ambulatory Visit: Payer: Self-pay | Admitting: Advanced Practice Midwife

## 2019-07-19 ENCOUNTER — Other Ambulatory Visit: Payer: Self-pay

## 2019-07-19 VITALS — BP 101/57 | HR 61 | Temp 97.8°F | Wt 176.2 lb

## 2019-07-19 DIAGNOSIS — Z8742 Personal history of other diseases of the female genital tract: Secondary | ICD-10-CM

## 2019-07-19 DIAGNOSIS — Z9119 Patient's noncompliance with other medical treatment and regimen: Secondary | ICD-10-CM

## 2019-07-19 DIAGNOSIS — O24419 Gestational diabetes mellitus in pregnancy, unspecified control: Secondary | ICD-10-CM

## 2019-07-19 DIAGNOSIS — O0990 Supervision of high risk pregnancy, unspecified, unspecified trimester: Secondary | ICD-10-CM

## 2019-07-19 DIAGNOSIS — O09893 Supervision of other high risk pregnancies, third trimester: Secondary | ICD-10-CM

## 2019-07-19 DIAGNOSIS — Z91199 Patient's noncompliance with other medical treatment and regimen due to unspecified reason: Secondary | ICD-10-CM

## 2019-07-19 LAB — URINALYSIS
Bilirubin, UA: NEGATIVE
Glucose, UA: NEGATIVE
Ketones, UA: NEGATIVE
Nitrite, UA: NEGATIVE
Protein,UA: NEGATIVE
RBC, UA: NEGATIVE
Specific Gravity, UA: 1.015 (ref 1.005–1.030)
Urobilinogen, Ur: 0.2 mg/dL (ref 0.2–1.0)
pH, UA: 6.5 (ref 5.0–7.5)

## 2019-07-19 NOTE — Progress Notes (Signed)
   PRENATAL VISIT NOTE  Subjective:  Sierra Reynolds is a 29 y.o. G3P2002 at [redacted]w[redacted]d being seen today for ongoing prenatal care.  She is currently monitored for the following issues for this high-risk pregnancy and has History of abnormal cervical Pap smear; Supervision of high risk pregnancy, antepartum; Abnormal glucose affecting pregnancy: 3 hr results equivocal; Late prenatal care, antepartum; Dental abscess; Gestational diabetes mellitus (GDM) affecting pregnancy, antepartum 06/02/19; and Noncompliant prenatal care in gestational diabetic x 3 wks on their problem list.  Patient reports no complaints.  Contractions: Not present. Vag. Bleeding: None.  Movement: Present. Denies leaking of fluid/ROM.   The following portions of the patient's history were reviewed and updated as appropriate: allergies, current medications, past family history, past medical history, past social history, past surgical history and problem list. Problem list updated.  Objective:   Vitals:   07/19/19 1555  BP: (!) 101/57  Pulse: 61  Temp: 97.8 F (36.6 C)  Weight: 176 lb 3.2 oz (79.9 kg)    Fetal Status: Fetal Heart Rate (bpm): 130 Fundal Height: 34 cm Movement: Present     General:  Alert, oriented and cooperative. Patient is in no acute distress.  Skin: Skin is warm and dry. No rash noted.   Cardiovascular: Normal heart rate noted  Respiratory: Normal respiratory effort, no problems with respiration noted  Abdomen: Soft, gravid, appropriate for gestational age.  Pain/Pressure: Absent     Pelvic: Cervical exam deferred        Extremities: Normal range of motion.  Edema: None  Mental Status: Normal mood and affect. Normal behavior. Normal judgment and thought content.   Assessment and Plan:  Pregnancy: G3P2002 at [redacted]w[redacted]d  1. Gestational diabetes mellitus (GDM) affecting pregnancy, antepartum since 06/02/19 FBS: 64-92 2 hr pp: 82-121 Breakfast:  Oatmeal, 1 apple, water Snack: 1 piece  watermelon Lunch:  2 cheese quesadillas, water Dinner last night:  Noodle soup with vegetable, water, rice pudding beverage Walks 6x/wk x 20-30 min  - Urinalysis (Urine Dip)  2. Supervision of high risk pregnancy, antepartum Feels well. - Urinalysis (Urine Dip)  3. History of abnormal cervical Pap smear Needs pap 02/2020  4. Noncompliant pregnant patient in third trimester 3 wks since last appt--stressed importance of weekly apts with gestational diabetes   Preterm labor symptoms and general obstetric precautions including but not limited to vaginal bleeding, contractions, leaking of fluid and fetal movement were reviewed in detail with the patient. Please refer to After Visit Summary for other counseling recommendations.  Return in about 1 week (around 07/26/2019) for routine PNC.  Future Appointments  Date Time Provider Department Center  07/26/2019  1:40 PM [facility] PROVIDER [facility] None    Alberteen Spindle, CNM

## 2019-07-19 NOTE — Progress Notes (Signed)
In for MH RV at 34.0 weeks. BS log copied. Denies hospital visits. Taking PNV. Sharlyne Pacas, RN

## 2019-07-26 ENCOUNTER — Ambulatory Visit: Payer: Self-pay | Admitting: Family Medicine

## 2019-07-26 ENCOUNTER — Encounter: Payer: Self-pay | Admitting: Family Medicine

## 2019-07-26 ENCOUNTER — Other Ambulatory Visit: Payer: Self-pay

## 2019-07-26 VITALS — BP 73/44 | HR 80 | Temp 97.7°F | Wt 177.8 lb

## 2019-07-26 DIAGNOSIS — O0990 Supervision of high risk pregnancy, unspecified, unspecified trimester: Secondary | ICD-10-CM

## 2019-07-26 DIAGNOSIS — O09893 Supervision of other high risk pregnancies, third trimester: Secondary | ICD-10-CM

## 2019-07-26 DIAGNOSIS — O0930 Supervision of pregnancy with insufficient antenatal care, unspecified trimester: Secondary | ICD-10-CM

## 2019-07-26 DIAGNOSIS — Z9119 Patient's noncompliance with other medical treatment and regimen: Secondary | ICD-10-CM

## 2019-07-26 DIAGNOSIS — O24419 Gestational diabetes mellitus in pregnancy, unspecified control: Secondary | ICD-10-CM

## 2019-07-26 DIAGNOSIS — Z91199 Patient's noncompliance with other medical treatment and regimen due to unspecified reason: Secondary | ICD-10-CM

## 2019-07-26 LAB — URINALYSIS
Bilirubin, UA: POSITIVE — AB
Glucose, UA: NEGATIVE
Ketones, UA: NEGATIVE
Nitrite, UA: NEGATIVE
Protein,UA: NEGATIVE
RBC, UA: NEGATIVE
Specific Gravity, UA: 1.02 (ref 1.005–1.030)
Urobilinogen, Ur: 1 mg/dL (ref 0.2–1.0)
pH, UA: 6 (ref 5.0–7.5)

## 2019-07-26 MED ORDER — PRENATAL VITAMINS 28-0.8 MG PO TABS: 1.0000 | ORAL_TABLET | Freq: Every day | ORAL | 0 refills | Status: AC

## 2019-07-26 NOTE — Progress Notes (Signed)
In for visit; BS log copied; taking PNV (more given today); denies hospital visits since last appt Sharlette Dense, RN Informed of [facility] u/s 08/02/19 @ 2:00 & charge of $132.75 for u/s Sharlette Dense, RN

## 2019-07-26 NOTE — Progress Notes (Signed)
  PRENATAL VISIT NOTE  Subjective:  Sierra Reynolds is a 29 y.o. G3P2002 at [redacted]w[redacted]d being seen today for ongoing prenatal care.  She is currently monitored for the following issues for this high-risk pregnancy and has History of abnormal cervical Pap smear; Supervision of high risk pregnancy, antepartum; Late prenatal care, antepartum; Dental abscess; Gestational diabetes mellitus (GDM) affecting pregnancy, antepartum 06/02/19; and Noncompliant prenatal care in gestational diabetic x 3 wks on their problem list.  Patient reports no complaints.  Contractions: Not present. Vag. Bleeding: None.  Movement: Present. Denies leaking of fluid/ROM.   The following portions of the patient's history were reviewed and updated as appropriate: allergies, current medications, past family history, past medical history, past social history, past surgical history and problem list. Problem list updated.  Objective:   Vitals:   07/26/19 1339  BP: (!) 73/44  Pulse: 80  Temp: 97.7 F (36.5 C)  Weight: 177 lb 12.8 oz (80.6 kg)    Fetal Status: Fetal Heart Rate (bpm): 135 Fundal Height: 36 cm Movement: Present     General:  Alert, oriented and cooperative. Patient is in no acute distress.  Skin: Skin is warm and dry. No rash noted.   Cardiovascular: Normal heart rate noted  Respiratory: Normal respiratory effort, no problems with respiration noted  Abdomen: Soft, gravid, appropriate for gestational age.  Pain/Pressure: Absent     Pelvic: Cervical exam deferred        Extremities: Normal range of motion.  Edema: None  Mental Status: Normal mood and affect. Normal behavior. Normal judgment and thought content.   Assessment and Plan:  Pregnancy: G3P2002 at [redacted]w[redacted]d    1. Supervision of high risk pregnancy, antepartum -Up to date - Prenatal Vit-Fe Fumarate-FA (PRENATAL VITAMINS) 28-0.8 MG TABS; Take 1 tablet by mouth daily.  Dispense: 100 tablet; Refill: 0  2. Gestational diabetes mellitus (GDM) affecting  pregnancy, antepartum -Blood sugar log values below, great control. Encouraged continued daily exercise and diet modifications.  0 of 7 FBS values are abnormal, range 68-84 0 of 18 2hr pp values are abnormal, range 82 - 115 Recent food: Breakfast = oatmeal with apple, water           Lunch = quesadilla w/chicken and cheese, water           Dinner = 1 slice ham, pineapple pizza, water           Snack = nothing -Ultrasound - referred today for 36 wk [place] to [facility] -Will need IOL at 40 wks, plan to complete paperwork at 38 wks.   3. Late prenatal care, antepartum 4. Noncompliant pregnant patient in third trimester -Continues to have transportation issues, will fwd chart to Harrie Jeans for f/u.    Preterm labor symptoms and general obstetric precautions including but not limited to vaginal bleeding, contractions, leaking of fluid and fetal movement were reviewed in detail with the patient. Please refer to After Visit Summary for other counseling recommendations.  Return in about 1 week (around 08/02/2019) for routine prenatal care.  Future Appointments  Date Time Provider Department Center  08/02/2019  2:00 PM [facility] [place] 2 [facility] None  08/03/2019 11:00 AM [facility] PROVIDER [facility] None    Ann Held, PA-C

## 2019-08-02 ENCOUNTER — Other Ambulatory Visit: Payer: Self-pay | Admitting: Obstetrics & Gynecology

## 2019-08-02 ENCOUNTER — Other Ambulatory Visit: Payer: Self-pay

## 2019-08-02 ENCOUNTER — Ambulatory Visit (INDEPENDENT_AMBULATORY_CARE_PROVIDER_SITE_OTHER): Payer: Self-pay

## 2019-08-02 DIAGNOSIS — O24419 Gestational diabetes mellitus in pregnancy, unspecified control: Secondary | ICD-10-CM

## 2019-08-02 DIAGNOSIS — Z3A36 36 weeks gestation of pregnancy: Secondary | ICD-10-CM

## 2019-08-03 ENCOUNTER — Ambulatory Visit: Payer: Self-pay | Admitting: Advanced Practice Midwife

## 2019-08-03 DIAGNOSIS — E669 Obesity, unspecified: Secondary | ICD-10-CM

## 2019-08-03 DIAGNOSIS — O0990 Supervision of high risk pregnancy, unspecified, unspecified trimester: Secondary | ICD-10-CM

## 2019-08-03 DIAGNOSIS — O24419 Gestational diabetes mellitus in pregnancy, unspecified control: Secondary | ICD-10-CM

## 2019-08-03 LAB — URINALYSIS
Bilirubin, UA: NEGATIVE
Glucose, UA: NEGATIVE
Ketones, UA: NEGATIVE
Nitrite, UA: NEGATIVE
Protein,UA: NEGATIVE
RBC, UA: NEGATIVE
Specific Gravity, UA: 1.01 (ref 1.005–1.030)
Urobilinogen, Ur: 0.2 mg/dL (ref 0.2–1.0)
pH, UA: 7 (ref 5.0–7.5)

## 2019-08-03 NOTE — Progress Notes (Signed)
   PRENATAL VISIT NOTE  Subjective:  Sierra Reynolds is a 29 y.o. G3P2002 at [redacted]w[redacted]d being seen today for ongoing prenatal care.  She is currently monitored for the following issues for this high-risk pregnancy and has History of abnormal cervical Pap smear; Supervision of high risk pregnancy, antepartum; Late prenatal care, antepartum; Dental abscess; Gestational diabetes mellitus (GDM) affecting pregnancy, antepartum 06/02/19; Noncompliant prenatal care in gestational diabetic x 3 wks; and Obesity BMI=34.9 on their problem list.  Patient reports no complaints.  Contractions: Not present. Vag. Bleeding: None.  Movement: Present. Denies leaking of fluid/ROM.   The following portions of the patient's history were reviewed and updated as appropriate: allergies, current medications, past family history, past medical history, past social history, past surgical history and problem list. Problem list updated.  Objective:   Vitals:   08/03/19 1103  BP: (!) 92/58  Pulse: 80  Temp: (!) 97.5 F (36.4 C)  Weight: 178 lb 12.8 oz (81.1 kg)    Fetal Status: Fetal Heart Rate (bpm): 140 Fundal Height: 36 cm Movement: Present  Presentation: Vertex  General:  Alert, oriented and cooperative. Patient is in no acute distress.  Skin: Skin is warm and dry. No rash noted.   Cardiovascular: Normal heart rate noted  Respiratory: Normal respiratory effort, no problems with respiration noted  Abdomen: Soft, gravid, appropriate for gestational age.  Pain/Pressure: Absent     Pelvic: Cervical exam deferred        Extremities: Normal range of motion.  Edema: None  Mental Status: Normal mood and affect. Normal behavior. Normal judgment and thought content.   Assessment and Plan:  Pregnancy: G3P2002 at [redacted]w[redacted]d  1. Gestational diabetes mellitus (GDM) affecting pregnancy, antepartum FBS: 69-90 (0/8 abnormal values) 2 hr pp:  74-132 (1/21 abnormal values) Breakfast:  Oatmeal with water, 1 apple Dinner last  night:  2 quesadillas with beef and cheese, water Lunch yesterday: 1 hamburger on 2 buns, lettuce, pickles, french fries, water  Walks 5x/wk x 20-30 min - Urinalysis  2. Supervision of high risk pregnancy, antepartum GBS/GC/Chlamydia cultures obtained.  Knows when to go to L&D Reviewed 08/02/19 u/s at [facility] at 36.0 wks, vtx, EFW=61% (6#10), AC=94.4%, AFI=9.1 cm, posterior grade 2 placenta - Culture, beta strep (group b only) - Chlamydia/GC NAA, Confirmation - Urinalysis  3. Obesity, unspecified classification, unspecified obesity type, unspecified whether serious comorbidity present 4 lb 12.8 oz (2.177 kg) Not taking ASA 81 mg daily   Preterm labor symptoms and general obstetric precautions including but not limited to vaginal bleeding, contractions, leaking of fluid and fetal movement were reviewed in detail with the patient. Please refer to After Visit Summary for other counseling recommendations.  No follow-ups on file.  Future Appointments  Date Time Provider Department Center  08/10/2019 11:00 AM [facility] PROVIDER [facility] None    Alberteen Spindle, CNM

## 2019-08-03 NOTE — Progress Notes (Addendum)
Patient here for MH RV at 36 1/7. 36 week labs today, packet given and reviewed. BS log copied and needs urine dip. Patient prefers provider to collect labs. Patient states she had [facility] U/S yesterday.Burt Knack, RN

## 2019-08-03 NOTE — Progress Notes (Signed)
Client scheduled [facility] RV appt for 08/10/2019 with arrival time of 10/45 am. Reminder card given. Roddie Mc Yemen interpreted for above. Jossie Ng, RN

## 2019-08-05 LAB — CHLAMYDIA/GC NAA, CONFIRMATION
Chlamydia trachomatis, NAA: NEGATIVE
Neisseria gonorrhoeae, NAA: NEGATIVE

## 2019-08-09 LAB — CULTURE, BETA STREP (GROUP B ONLY): Strep Gp B Culture: NEGATIVE

## 2019-08-10 ENCOUNTER — Ambulatory Visit: Payer: Self-pay | Admitting: Advanced Practice Midwife

## 2019-08-10 ENCOUNTER — Other Ambulatory Visit: Payer: Self-pay

## 2019-08-10 VITALS — BP 102/66 | HR 84 | Temp 97.7°F | Wt 180.2 lb

## 2019-08-10 DIAGNOSIS — O0990 Supervision of high risk pregnancy, unspecified, unspecified trimester: Secondary | ICD-10-CM

## 2019-08-10 DIAGNOSIS — O24419 Gestational diabetes mellitus in pregnancy, unspecified control: Secondary | ICD-10-CM

## 2019-08-10 DIAGNOSIS — E669 Obesity, unspecified: Secondary | ICD-10-CM

## 2019-08-10 DIAGNOSIS — O0930 Supervision of pregnancy with insufficient antenatal care, unspecified trimester: Secondary | ICD-10-CM

## 2019-08-10 LAB — URINALYSIS
Bilirubin, UA: NEGATIVE
Glucose, UA: NEGATIVE
Nitrite, UA: NEGATIVE
RBC, UA: NEGATIVE
Specific Gravity, UA: 1.025 (ref 1.005–1.030)
Urobilinogen, Ur: 1 mg/dL (ref 0.2–1.0)
pH, UA: 6.5 (ref 5.0–7.5)

## 2019-08-10 NOTE — Progress Notes (Signed)
   PRENATAL VISIT NOTE  Subjective:  Sierra Reynolds is a 29 y.o. G3P2002 at [redacted]w[redacted]d being seen today for ongoing prenatal care.  She is currently monitored for the following issues for this high-risk pregnancy and has History of abnormal cervical Pap smear; Supervision of high risk pregnancy, antepartum; Late prenatal care, antepartum; Dental abscess; Gestational diabetes mellitus (GDM) affecting pregnancy, antepartum 06/02/19; Noncompliant prenatal care in gestational diabetic x 3 wks; and Obesity BMI=34.9 on their problem list.  Patient reports suprapubic discomfort when walking or changing position.  Contractions: Not present. Vag. Bleeding: None.  Movement: Present. Denies leaking of fluid/ROM.   The following portions of the patient's history were reviewed and updated as appropriate: allergies, current medications, past family history, past medical history, past social history, past surgical history and problem list. Problem list updated.  Objective:   Vitals:   08/10/19 1104  BP: 102/66  Pulse: 84  Temp: 97.7 F (36.5 C)  Weight: 180 lb 3.2 oz (81.7 kg)    Fetal Status: Fetal Heart Rate (bpm): 120 Fundal Height: 36 cm Movement: Present  Presentation: Vertex  General:  Alert, oriented and cooperative. Patient is in no acute distress.  Skin: Skin is warm and dry. No rash noted.   Cardiovascular: Normal heart rate noted  Respiratory: Normal respiratory effort, no problems with respiration noted  Abdomen: Soft, gravid, appropriate for gestational age.  Pain/Pressure: Absent     Pelvic: Cervical exam deferred        Extremities: Normal range of motion.  Edema: None  Mental Status: Normal mood and affect. Normal behavior. Normal judgment and thought content.   Assessment and Plan:  Pregnancy: G3P2002 at [redacted]w[redacted]d  1. Gestational diabetes mellitus (GDM) affecting pregnancy, antepartum dx'd 06/02/19 Blood sugar log values below. Encouraged continued daily exercise and diet  modifications. 0 of  7 FBS values are abnormal (range was 74 to 89) 1 of  21 2hr pp values are abnormal (range was 74 to 121) Exercise: walks 0 minutes per day Diet recall :   Breakfast = 1 bowl Frosted Flakes with milk                      Lunch = yesterday: sphaghetti with chicken, water                      Dinner = yesterday: steak, beans, 2 tortillas, water                      Snack = yesterday: strawberries and grapes Water: drinks about  3 bottles of water/day and night Hasn't been walking since 08/07/19 when symphysis pubis began hurting her.  Suggested swimming instead for exercise.  - Urinalysis (Urine Dip)  2. Late prenatal care, antepartum   3. Supervision of high risk pregnancy, antepartum Reviewed [facility] u/s 08/02/19 with EFW=61.1%, AC=94.4%, vtx, posterior placenta grade 2, AFI=9.1 cm  4. Obesity, unspecified classification, unspecified obesity type, unspecified whether serious comorbidity present 6 lb 3.2 oz (2.812 kg) Not taking ASA 81 mg daily   Preterm labor symptoms and general obstetric precautions including but not limited to vaginal bleeding, contractions, leaking of fluid and fetal movement were reviewed in detail with the patient. Please refer to After Visit Summary for other counseling recommendations.  Return in about 1 week (around 08/17/2019) for routine PNC, f/u BS log/GDM.  No future appointments.  Alberteen Spindle, CNM

## 2019-08-10 NOTE — Progress Notes (Signed)
Copied blood sugar log. Pt to lab for urine dip. Pt is taking PNV. Pt denies visits to ER since last appt with [facility].

## 2019-08-17 ENCOUNTER — Other Ambulatory Visit: Payer: Self-pay

## 2019-08-17 ENCOUNTER — Ambulatory Visit: Payer: Self-pay | Admitting: Family Medicine

## 2019-08-17 ENCOUNTER — Telehealth: Payer: Self-pay

## 2019-08-17 ENCOUNTER — Encounter: Payer: Self-pay | Admitting: Family Medicine

## 2019-08-17 VITALS — BP 95/63 | HR 72 | Temp 98.2°F | Wt 180.0 lb

## 2019-08-17 DIAGNOSIS — Z91199 Patient's noncompliance with other medical treatment and regimen due to unspecified reason: Secondary | ICD-10-CM

## 2019-08-17 DIAGNOSIS — O0930 Supervision of pregnancy with insufficient antenatal care, unspecified trimester: Secondary | ICD-10-CM

## 2019-08-17 DIAGNOSIS — O24419 Gestational diabetes mellitus in pregnancy, unspecified control: Secondary | ICD-10-CM

## 2019-08-17 DIAGNOSIS — Z9119 Patient's noncompliance with other medical treatment and regimen: Secondary | ICD-10-CM

## 2019-08-17 DIAGNOSIS — O09893 Supervision of other high risk pregnancies, third trimester: Secondary | ICD-10-CM

## 2019-08-17 DIAGNOSIS — E669 Obesity, unspecified: Secondary | ICD-10-CM

## 2019-08-17 DIAGNOSIS — O0990 Supervision of high risk pregnancy, unspecified, unspecified trimester: Secondary | ICD-10-CM

## 2019-08-17 LAB — URINALYSIS
Bilirubin, UA: NEGATIVE
Glucose, UA: NEGATIVE
Ketones, UA: NEGATIVE
Nitrite, UA: NEGATIVE
Protein,UA: NEGATIVE
RBC, UA: NEGATIVE
Specific Gravity, UA: 1.015 (ref 1.005–1.030)
Urobilinogen, Ur: 0.2 mg/dL (ref 0.2–1.0)
pH, UA: 7 (ref 5.0–7.5)

## 2019-08-17 NOTE — Telephone Encounter (Signed)
Telephone inadvertently opened - encounter signed.

## 2019-08-17 NOTE — Progress Notes (Signed)
Urine dip today. Blood sugar log copied and in outguide for provider review / scanning. Jossie Ng, RN  Urine dip result reviewed by provider. Jossie Ng, RN

## 2019-08-17 NOTE — Progress Notes (Signed)
  PRENATAL VISIT NOTE  Subjective:  Sierra Reynolds is a 29 y.o. G3P2002 at [redacted]w[redacted]d being seen today for ongoing prenatal care.  She is currently monitored for the following issues for this high-risk pregnancy and has History of abnormal cervical Pap smear; Supervision of high risk pregnancy, antepartum; Late prenatal care, antepartum; Dental abscess; Gestational diabetes mellitus (GDM) affecting pregnancy, antepartum 06/02/19; Noncompliant prenatal care in gestational diabetic x 3 wks; and Obesity BMI=34.9 on their problem list.  Patient reports no complaints.  Contractions: Not present. Vag. Bleeding: None.  Movement: Present. Denies leaking of fluid/ROM.   The following portions of the patient's history were reviewed and updated as appropriate: allergies, current medications, past family history, past medical history, past social history, past surgical history and problem list. Problem list updated.  Objective:   Vitals:   08/17/19 1007  BP: 95/63  Pulse: 72  Temp: 98.2 F (36.8 C)  Weight: 180 lb (81.6 kg)    Fetal Status: Fetal Heart Rate (bpm): 140 Fundal Height: 39 cm Movement: Present  Presentation: Vertex  General:  Alert, oriented and cooperative. Patient is in no acute distress.  Skin: Skin is warm and dry. No rash noted.   Cardiovascular: Normal heart rate noted  Respiratory: Normal respiratory effort, no problems with respiration noted  Abdomen: Soft, gravid, appropriate for gestational age.  Pain/Pressure: Absent     Pelvic: Cervical exam performed Dilation: 1 Effacement (%): 30 Station: -3  Extremities: Normal range of motion.  Edema: None  Mental Status: Normal mood and affect. Normal behavior. Normal judgment and thought content.   Assessment and Plan:  Pregnancy: G3P2002 at [redacted]w[redacted]d   1. Supervision of high risk pregnancy, antepartum -Up to date. IOL form completed today for IOL at 40 wks (08/30/19) d/t GDM.  2. Gestational diabetes mellitus (GDM) affecting  pregnancy, antepartum -Blood sugar log values below, excellent control. Encouraged continued daily exercise and diet modifications.  0 of 6 FBS values are abnormal, range 69-92 0 of 18 2hr pp values are abnormal, range 82-115 Exercise: none d/t pubic symphsis pain, hopes to restart walking this wk Recent food: Breakfast = oatmeal, apple, water smoothie           Lunch = potatoes w/fried eggs, taquito w/2 tortillas           Dinner = hard tacos w/cheese, lettuce, beans, cabbage, dressing           Snack = banana, water Water: 3 glasses/bottles per day -Urine results wnl.  -Ultrasound 7/27: EFW= 61.1%, AC=94.4% -Will need IOL at 40 wks, [facility] paperwork completed today - Urinalysis (Urine Dip)  3. Late prenatal care, antepartum  4. Noncompliant pregnant patient in third trimester -Congratulated and encouraged continued compliance  5. Obesity, unspecified classification, unspecified obesity type, unspecified whether serious comorbidity present 6 lb (2.722 kg)    Term labor symptoms and general obstetric precautions including but not limited to vaginal bleeding, contractions, leaking of fluid and fetal movement were reviewed in detail with the patient. Please refer to After Visit Summary for other counseling recommendations.  Return in about 1 week (around 08/24/2019) for routine prenatal care.  Future Appointments  Date Time Provider Department Center  08/24/2019 11:00 AM [facility] PROVIDER [facility] None    Ann Held, PA-C

## 2019-08-24 ENCOUNTER — Ambulatory Visit: Payer: Self-pay | Admitting: Advanced Practice Midwife

## 2019-08-24 ENCOUNTER — Other Ambulatory Visit: Payer: Self-pay

## 2019-08-24 VITALS — BP 102/65 | HR 59 | Temp 97.0°F | Wt 181.0 lb

## 2019-08-24 DIAGNOSIS — Z9119 Patient's noncompliance with other medical treatment and regimen: Secondary | ICD-10-CM

## 2019-08-24 DIAGNOSIS — O24419 Gestational diabetes mellitus in pregnancy, unspecified control: Secondary | ICD-10-CM

## 2019-08-24 DIAGNOSIS — O09893 Supervision of other high risk pregnancies, third trimester: Secondary | ICD-10-CM

## 2019-08-24 DIAGNOSIS — Z8742 Personal history of other diseases of the female genital tract: Secondary | ICD-10-CM

## 2019-08-24 DIAGNOSIS — E669 Obesity, unspecified: Secondary | ICD-10-CM

## 2019-08-24 DIAGNOSIS — O0990 Supervision of high risk pregnancy, unspecified, unspecified trimester: Secondary | ICD-10-CM

## 2019-08-24 DIAGNOSIS — O0930 Supervision of pregnancy with insufficient antenatal care, unspecified trimester: Secondary | ICD-10-CM

## 2019-08-24 LAB — URINALYSIS
Bilirubin, UA: NEGATIVE
Glucose, UA: NEGATIVE
Ketones, UA: NEGATIVE
Leukocytes,UA: NEGATIVE
Nitrite, UA: NEGATIVE
Protein,UA: NEGATIVE
RBC, UA: NEGATIVE
Specific Gravity, UA: 1.005 (ref 1.005–1.030)
Urobilinogen, Ur: 0.2 mg/dL (ref 0.2–1.0)
pH, UA: 6 (ref 5.0–7.5)

## 2019-08-24 NOTE — Progress Notes (Signed)
PRENATAL VISIT NOTE  Subjective:  Sierra Reynolds is a 29 y.o. G3P2002 at [redacted]w[redacted]d being seen today for ongoing prenatal care.  She is currently monitored for the following issues for this high-risk pregnancy and has History of abnormal cervical Pap smear; Supervision of high risk pregnancy, antepartum; Late prenatal care, antepartum; Dental abscess; Gestational diabetes mellitus (GDM) affecting pregnancy, antepartum 06/02/19; Noncompliant prenatal care in gestational diabetic x 3 wks; and Obesity BMI=34.9 on their problem list.  Patient reports no complaints.  Contractions: Irritability. Vag. Bleeding: None.  Movement: Present. Denies leaking of fluid/ROM.   The following portions of the patient's history were reviewed and updated as appropriate: allergies, current medications, past family history, past medical history, past social history, past surgical history and problem list. Problem list updated.  Objective:   Vitals:   08/24/19 1053  BP: 102/65  Pulse: (!) 59  Temp: (!) 97 F (36.1 C)  Weight: 181 lb (82.1 kg)    Fetal Status: Fetal Heart Rate (bpm): 120 Fundal Height: 39 cm Movement: Present  Presentation: Vertex  General:  Alert, oriented and cooperative. Patient is in no acute distress.  Skin: Skin is warm and dry. No rash noted.   Cardiovascular: Normal heart rate noted  Respiratory: Normal respiratory effort, no problems with respiration noted  Abdomen: Soft, gravid, appropriate for gestational age.  Pain/Pressure: Absent     Pelvic: Cervical exam deferred        Extremities: Normal range of motion.  Edema: Trace  Mental Status: Normal mood and affect. Normal behavior. Normal judgment and thought content.   Assessment and Plan:  Pregnancy: G3P2002 at [redacted]w[redacted]d  1. Gestational diabetes mellitus (GDM) affecting pregnancy, antepartum Blood sugar log values below. Encouraged continued daily exercise and diet modifications. 0 of  7 FBS values are abnormal (range was 72 to  90) 1of 20 2hr pp values are abnormal (range was 82 to ) Exercise: walks 20. minutes 3x/wk Diet recall :   Breakfast = 1% milk and 7 Mexican cookies                      Lunch = yesterday: noodle soup with potatoes, carrots, 2 tortillas, 3 chocolates, water                      Dinner = yesterday: soy mole with rice, potatoes, 2 tortillas, water                      Snack = none Pt states she ate breakfast at 0900 today and didn't check her 2 hour pp because she "had to come to this apt".  Pt also didn't check FBS on 08/17/19 because "I had an apt here in the early morning".  Also didn't check 08/17/19 2 hr pp after breakfast.  Instructed pt to bring her glucometer with her so she can check her BS wherever she may be. Water: drinks about  1 bottles of water/day and night   - Urinalysis (Urine Dip)  2. Late prenatal care, antepartum   3. Supervision of high risk pregnancy, antepartum Has car seat and ready for baby at home.  Knows when to go to L&D.  IOL paperwork faxed to [facility] 08/18/19 with no response yet - Urinalysis (Urine Dip)  4. Obesity, unspecified classification, unspecified obesity type, unspecified whether serious comorbidity present 7 lb (3.175 kg) Not taking ASA  5. History of abnormal cervical Pap smear Needs repeat 2022  6. Noncompliant pregnant patient in third trimester    Term labor symptoms and general obstetric precautions including but not limited to vaginal bleeding, contractions, leaking of fluid and fetal movement were reviewed in detail with the patient. Please refer to After Visit Summary for other counseling recommendations.  Return in about 1 week (around 08/31/2019) for routine PNC.  Future Appointments  Date Time Provider Department Center  08/29/2019 10:40 AM [facility] PROVIDER [facility] None    Alberteen Spindle, CNM

## 2019-08-24 NOTE — Progress Notes (Signed)
Here today for 39.1 week MH RV. Taking PNV QD. Denies ED/hospital visits since last RV. No IOL date yet. [facility] to call this pm with IOL appt. BS log copy and UA today. Tawny Hopping, RN

## 2019-08-25 ENCOUNTER — Telehealth: Payer: Self-pay

## 2019-08-25 ENCOUNTER — Inpatient Hospital Stay
Admission: EM | Admit: 2019-08-25 | Discharge: 2019-08-27 | DRG: 807 | Disposition: A | Discharge status: Home / Self Care | Payer: Medicaid Other | Attending: Obstetrics and Gynecology | Admitting: Obstetrics and Gynecology

## 2019-08-25 ENCOUNTER — Encounter: Payer: Self-pay | Admitting: Certified Nurse Midwife

## 2019-08-25 ENCOUNTER — Other Ambulatory Visit: Payer: Self-pay

## 2019-08-25 DIAGNOSIS — Z3A39 39 weeks gestation of pregnancy: Secondary | ICD-10-CM

## 2019-08-25 DIAGNOSIS — O0930 Supervision of pregnancy with insufficient antenatal care, unspecified trimester: Secondary | ICD-10-CM

## 2019-08-25 DIAGNOSIS — Z20822 Contact with and (suspected) exposure to covid-19: Secondary | ICD-10-CM | POA: Diagnosis present

## 2019-08-25 DIAGNOSIS — O24410 Gestational diabetes mellitus in pregnancy, diet controlled: Secondary | ICD-10-CM

## 2019-08-25 DIAGNOSIS — O0932 Supervision of pregnancy with insufficient antenatal care, second trimester: Secondary | ICD-10-CM

## 2019-08-25 DIAGNOSIS — O24420 Gestational diabetes mellitus in childbirth, diet controlled: Secondary | ICD-10-CM | POA: Diagnosis present

## 2019-08-25 DIAGNOSIS — O99214 Obesity complicating childbirth: Secondary | ICD-10-CM | POA: Diagnosis present

## 2019-08-25 DIAGNOSIS — O09899 Supervision of other high risk pregnancies, unspecified trimester: Secondary | ICD-10-CM

## 2019-08-25 DIAGNOSIS — O0990 Supervision of high risk pregnancy, unspecified, unspecified trimester: Secondary | ICD-10-CM

## 2019-08-25 DIAGNOSIS — O26893 Other specified pregnancy related conditions, third trimester: Secondary | ICD-10-CM | POA: Diagnosis present

## 2019-08-25 DIAGNOSIS — Z23 Encounter for immunization: Secondary | ICD-10-CM

## 2019-08-25 DIAGNOSIS — E669 Obesity, unspecified: Secondary | ICD-10-CM | POA: Diagnosis present

## 2019-08-25 DIAGNOSIS — O24419 Gestational diabetes mellitus in pregnancy, unspecified control: Secondary | ICD-10-CM

## 2019-08-25 LAB — TYPE AND SCREEN
ABO/RH(D): A POS
Antibody Screen: NEGATIVE

## 2019-08-25 LAB — CBC
HCT: 36.7 % (ref 36.0–46.0)
Hemoglobin: 12.6 g/dL (ref 12.0–15.0)
MCH: 29.9 pg (ref 26.0–34.0)
MCHC: 34.3 g/dL (ref 30.0–36.0)
MCV: 87.2 fL (ref 80.0–100.0)
Platelets: 261 10*3/uL (ref 150–400)
RBC: 4.21 MIL/uL (ref 3.87–5.11)
RDW: 13.6 % (ref 11.5–15.5)
WBC: 6.7 10*3/uL (ref 4.0–10.5)
nRBC: 0 % (ref 0.0–0.2)

## 2019-08-25 LAB — GLUCOSE, CAPILLARY: Glucose-Capillary: 102 mg/dL — ABNORMAL HIGH (ref 70–99)

## 2019-08-25 LAB — SARS CORONAVIRUS 2 BY RT PCR (HOSPITAL ORDER, PERFORMED IN ~~LOC~~ HOSPITAL LAB): SARS Coronavirus 2: NEGATIVE

## 2019-08-25 MED ORDER — OXYCODONE-ACETAMINOPHEN 5-325 MG PO TABS: 2.0000 | ORAL_TABLET | ORAL | Status: DC | PRN

## 2019-08-25 MED ORDER — OXYTOCIN 10 UNIT/ML IJ SOLN
INTRAMUSCULAR | Status: AC
  Filled 2019-08-25: qty 1

## 2019-08-25 MED ORDER — WITCH HAZEL-GLYCERIN EX PADS: 1.0000 "application " | MEDICATED_PAD | CUTANEOUS | Status: DC | PRN

## 2019-08-25 MED ORDER — ONDANSETRON HCL 4 MG/2ML IJ SOLN: 4.0000 mg | Freq: Four times a day (QID) | INTRAMUSCULAR | Status: DC | PRN

## 2019-08-25 MED ORDER — MISOPROSTOL 200 MCG PO TABS
ORAL_TABLET | ORAL | Status: AC
  Filled 2019-08-25: qty 4

## 2019-08-25 MED ORDER — ACETAMINOPHEN 325 MG PO TABS: 650.0000 mg | ORAL_TABLET | ORAL | Status: DC | PRN

## 2019-08-25 MED ORDER — SIMETHICONE 80 MG PO CHEW: 80.0000 mg | CHEWABLE_TABLET | ORAL | Status: DC | PRN

## 2019-08-25 MED ORDER — DIBUCAINE (PERIANAL) 1 % EX OINT: 1.0000 "application " | TOPICAL_OINTMENT | CUTANEOUS | Status: DC | PRN

## 2019-08-25 MED ORDER — PRENATAL MULTIVITAMIN CH
1.0000 | ORAL_TABLET | Freq: Every day | ORAL | Status: DC
  Administered 2019-08-26 – 2019-08-27 (×2): 1 via ORAL
  Filled 2019-08-25 (×2): qty 1

## 2019-08-25 MED ORDER — OXYCODONE-ACETAMINOPHEN 5-325 MG PO TABS: 1.0000 | ORAL_TABLET | ORAL | Status: DC | PRN

## 2019-08-25 MED ORDER — SENNOSIDES-DOCUSATE SODIUM 8.6-50 MG PO TABS
2.0000 | ORAL_TABLET | ORAL | Status: DC
  Administered 2019-08-26: 2 via ORAL
  Filled 2019-08-25: qty 2

## 2019-08-25 MED ORDER — ONDANSETRON HCL 4 MG PO TABS
4.0000 mg | ORAL_TABLET | ORAL | Status: DC | PRN
  Filled 2019-08-25: qty 1

## 2019-08-25 MED ORDER — BENZOCAINE-MENTHOL 20-0.5 % EX AERO: 1.0000 "application " | INHALATION_SPRAY | CUTANEOUS | Status: DC | PRN

## 2019-08-25 MED ORDER — IBUPROFEN 600 MG PO TABS
600.0000 mg | ORAL_TABLET | Freq: Four times a day (QID) | ORAL | Status: DC
  Administered 2019-08-26 (×2): 600 mg via ORAL
  Filled 2019-08-25 (×2): qty 1

## 2019-08-25 MED ORDER — ONDANSETRON HCL 4 MG/2ML IJ SOLN: 4.0000 mg | INTRAMUSCULAR | Status: DC | PRN

## 2019-08-25 MED ORDER — OXYTOCIN BOLUS FROM INFUSION: 333.0000 mL | Freq: Once | INTRAVENOUS | Status: DC

## 2019-08-25 MED ORDER — ZOLPIDEM TARTRATE 5 MG PO TABS: 5.0000 mg | ORAL_TABLET | Freq: Every evening | ORAL | Status: DC | PRN

## 2019-08-25 MED ORDER — DIPHENHYDRAMINE HCL 25 MG PO CAPS: 25.0000 mg | ORAL_CAPSULE | Freq: Four times a day (QID) | ORAL | Status: DC | PRN

## 2019-08-25 MED ORDER — COCONUT OIL OIL
1.0000 "application " | TOPICAL_OIL | Status: DC | PRN
  Filled 2019-08-25: qty 120

## 2019-08-25 MED ORDER — OXYTOCIN-SODIUM CHLORIDE 30-0.9 UT/500ML-% IV SOLN
2.5000 [IU]/h | INTRAVENOUS | Status: DC
  Administered 2019-08-25: 2.5 [IU]/h via INTRAVENOUS

## 2019-08-25 MED ORDER — AMMONIA AROMATIC IN INHA
RESPIRATORY_TRACT | Status: AC
  Filled 2019-08-25: qty 10

## 2019-08-25 MED ORDER — OXYTOCIN-SODIUM CHLORIDE 30-0.9 UT/500ML-% IV SOLN
INTRAVENOUS | Status: AC
  Administered 2019-08-25: 333 mL via INTRAVENOUS
  Filled 2019-08-25: qty 1000

## 2019-08-25 NOTE — OB Triage Note (Signed)
Pt is a 29 yo, G3P2 started having contractions that lasted every 1-2 hours. Bloody discharge then the contraction started coming every hour, now every 5 min. Can not remember if she felt a LOF, Contraction are very strong. NKA, No N/V/D

## 2019-08-25 NOTE — Telephone Encounter (Signed)
TC to patient to inform of [facility] IOL on 08/31/19 at 0800. Patient aware of date/time, and next appt [facility] on 08/29/19. Patient states she has been having contractions since this morning. Contractions about 15 minutes apart, and some mucous with tinge of blood earlier this afternoon. Patient states baby moving as usual, denies fluid leak, denies gush of fluid. Patient counseled to go to hospital if she thinks her water is broken or when contractions get to to be about 5 minutes apart. Patient referred to her 36 week packet with information about counting contractions. Patient states she still has her packet. Patient denies further questions. Interpreter V. Olmedo.Burt Knack, RN

## 2019-08-25 NOTE — H&P (Signed)
Obstetric History and Physical  Adalia Darby Fleeman is an unassigned 29 y.o. [phone] with IUP at 35w2dpresenting for complaints of contractions since 0800. Patient states she has been having  regular, every 2-3 minutes contractions, no vaginal bleeding, intact membranes, with active fetal movement.    [facility] 15 weeks Pregnancy complications or risks: Patient Active Problem List   Diagnosis Date Noted  . Obesity BMI=34.9 08/03/2019  . Noncompliant prenatal care in gestational diabetic x 3 wks 07/19/2019  . Gestational diabetes mellitus (GDM) affecting pregnancy, antepartum 06/02/19 06/03/2019  . Dental abscess 05/05/2019  . Late prenatal care, antepartum 03/11/2019  . Supervision of high risk pregnancy, antepartum 03/08/2019  . History of abnormal cervical Pap smear 05/07/2016   She plans to breastfeed She desires condoms for postpartum contraception.   Prenatal labs and studies: ABO, Rh: A/Positive/-- (03/02 1023) Antibody: Negative (03/02 1023) Rubella:   RPR: Non Reactive (05/27 0930)  HBsAg: Negative (03/02 1023)  HIV: Non-reactive (05/27 0000)  GCHY:IFOYDXAJ/- (07/28 1200) 1 hr Glucola  Abnormal (169), with equivocal 3 hour GTT.  Genetic screening normal Anatomy UKoreanormal  Prenatal Transfer Tool  Maternal Diabetes: Yes:  Diabetes Type:  Diet controlled Genetic Screening: Normal  Quad screen Maternal Ultrasounds/Referrals: Normal Fetal Ultrasounds or other Referrals:  None Maternal Substance Abuse:  No Significant Maternal Medications:  None Significant Maternal Lab Results: Group B Strep negative  Past Medical History:  Diagnosis Date  . Anemia    Prior pregnancy  . Gestational diabetes    2017 GDM -oral med  . Morbid obesity (HVerdi 08/06/2017   BMI=42.6  . Third degree perineal laceration    G1  . Vaginal Pap smear, abnormal    2017 LGSIL/ 05/2016 Colpo done-c/w CIN I/ 2/19 Pap  Neg    Past Surgical History:  Procedure Laterality Date  . Denies surgical history    . Repair of third degree laceration      OB History  Gravida Para Term Preterm AB Living  3 3 3  0 0 3  SAB TAB Ectopic Multiple Live Births  0 0 0 0 3    # Outcome Date GA Lbr Len/2nd Weight Sex Delivery Anes PTL Lv  3 Term 08/25/19 [id number] g  Vag-Spont None  LIV     Birth Comments: no anomalies noted at delivery  2 Term 08/06/17 [id number]:15 4220 g M Vag-Spont EPI  LIV  1 Term 01/29/16 40w49w5d14 g F Vag-Spont EPI  LIV     Complications: Third degree perineal laceration    Social History   Socioeconomic History  . Marital status: Single    Spouse name: Not on file  . Number of children: 2  . Years of education: 16  74Highest education level: Not on file  Occupational History  . Occupation: homemaker  Tobacco Use  . Smoking status: Never Smoker  . Smokeless tobacco: Never Used  Vaping Use  . Vaping Use: Never used  Substance and Sexual Activity  . Alcohol use: No  . Drug use: No  . Sexual activity: Yes    Partners: Male    Birth control/protection: Condom  Other Topics Concern  . Not on file  Social History Narrative   ** Merged History Encounter **       Social Determinants of Health   Financial Resource Strain:   . Difficulty of Paying Living Expenses: Not on file  Food Insecurity: No Food Insecurity  . Worried About Charity fundraiser in the Last Year: Never true  . Ran Out of Food in the Last Year: Never true  Transportation Needs: No Transportation Needs  . Lack of Transportation (Medical): No  . Lack of Transportation (Non-Medical): No  Physical Activity:   . Days of Exercise per Week: Not on file  . Minutes of Exercise per Session: Not on file  Stress:   . Feeling of Stress : Not on file  Social Connections:   . Frequency of Communication with Friends and Family: Not on file  . Frequency of Social Gatherings with Friends and Family: Not on file  .  Attends Religious Services: Not on file  . Active Member of Clubs or Organizations: Not on file  . Attends Archivist Meetings: Not on file  . Marital Status: Not on file    Family History  Problem Relation Age of Onset  . Heart disease Maternal Grandmother   . Hypertension Maternal Grandmother   . Heart disease Maternal Grandfather   . Cancer Maternal Grandfather   . Breast cancer Neg Hx   . Ovarian cancer Neg Hx   . Diabetes Neg Hx     Medications Prior to Admission  Medication Sig Dispense Refill Last Dose  . Accu-Chek Softclix Lancets lancets 1 each by Other route 4 (four) times daily. 120 each 5 08/25/2019 at Unknown time  . Blood Glucose Monitoring Suppl (ACCU-CHEK NANO SMARTVIEW) w/Device KIT 1 kit by Subdermal route as directed. Check blood sugars for fasting, and two hours after breakfast, lunch and dinner (4 checks daily) 1 kit 0 08/25/2019 at Unknown time  . glucose blood (ACCU-CHEK SMARTVIEW) test strip Use as instructed to check blood sugars 120 each 5 08/25/2019 at Unknown time  . Prenatal Vit-Fe Fumarate-FA (PRENATAL VITAMINS) 28-0.8 MG TABS Take 1 tablet by mouth daily. 100 tablet 0 08/25/2019 at Unknown time    No Known Allergies  Review of Systems: Negative except for what is mentioned in HPI.  Physical Exam: BP 108/60   Pulse (!) 57   Temp 97.9 F (36.6 C) (Oral)   LMP 11/23/2018 (Exact Date)   Breastfeeding Unknown  CONSTITUTIONAL: Well-developed, well-nourished female in moderate distress with contractions.  HENT:  Normocephalic, atraumatic, External right and left ear normal. Oropharynx is clear and moist EYES: Conjunctivae and EOM are normal. Pupils are equal, round, and reactive to light. No scleral icterus.  NECK: Normal range of motion, supple, no masses SKIN: Skin is warm and dry. No rash noted. Not diaphoretic. No erythema. No pallor. NEUROLOGIC: Alert and oriented to person, place, and time. Normal reflexes, muscle tone coordination. No  cranial nerve deficit noted. PSYCHIATRIC: Normal mood and affect. Normal behavior. Normal judgment and thought content. CARDIOVASCULAR: Normal heart rate noted, regular rhythm RESPIRATORY: Effort and breath sounds normal, no problems with respiration noted ABDOMEN: Soft, nontender, nondistended, gravid. MUSCULOSKELETAL: Normal range of motion. No edema and no tenderness. 2+ distal pulses.  Cervical Exam: Dilatation 10 cm   Effacement 10%   Station +2   Presentation: cephalic FHT:  Baseline rate 130 bpm   Variability moderate  Accelerations present   Decelerations late x 1 from baseline to 90s with pushing.  Contractions: Every 2-4 mins   Pertinent Labs/Studies:   No results found for this or any previous visit (from the past 24 hour(s)).  Assessment : Breanne Olvera is a 29 y.o. G3P3003 at 10w2dbeing admitted for active labor, with  imminent delivery. H/o GDM (diet controlled) this pregnancy. Late PNC (entry to care at [redacted] weeks gestation). GBS negative.   Plan: Labor: Expectant management. Anticipate imminent delivery. Patient desired epidural however is in advanced stage of labor. Admission labs ordered including COVID screening. FWB: Overall reassuring fetal heart tracing, 1 deceleration noted with pushing.  GBS negative Delivery plan: Hopeful for vaginal delivery.    Rubie Maid, MD [facility]

## 2019-08-26 ENCOUNTER — Encounter: Payer: Self-pay | Admitting: Obstetrics and Gynecology

## 2019-08-26 LAB — CBC
HCT: 34 % — ABNORMAL LOW (ref 36.0–46.0)
Hemoglobin: 11.5 g/dL — ABNORMAL LOW (ref 12.0–15.0)
MCH: 30.1 pg (ref 26.0–34.0)
MCHC: 33.8 g/dL (ref 30.0–36.0)
MCV: 89 fL (ref 80.0–100.0)
Platelets: 201 10*3/uL (ref 150–400)
RBC: 3.82 MIL/uL — ABNORMAL LOW (ref 3.87–5.11)
RDW: 13.7 % (ref 11.5–15.5)
WBC: 6.7 10*3/uL (ref 4.0–10.5)
nRBC: 0 % (ref 0.0–0.2)

## 2019-08-26 MED ORDER — IBUPROFEN 600 MG PO TABS
600.0000 mg | ORAL_TABLET | Freq: Four times a day (QID) | ORAL | Status: DC
  Administered 2019-08-27 (×3): 600 mg via ORAL
  Filled 2019-08-26 (×3): qty 1

## 2019-08-26 MED ORDER — SENNOSIDES-DOCUSATE SODIUM 8.6-50 MG PO TABS: 2.0000 | ORAL_TABLET | ORAL | Status: DC

## 2019-08-26 NOTE — Progress Notes (Signed)
Post Partum Day # 1, s/p SVD. GDM (diet controlled).   Subjective: no complaints, up ad lib, voiding and tolerating PO  Objective: Temp:  [97.7 F (36.5 C)-98.2 F (36.8 C)] 97.7 F (36.5 C) (08/20 0849) Pulse Rate:  [51-84] 51 (08/20 0849) Resp:  [12-18] 12 (08/20 0849) BP: (81-142)/(40-117) 102/66 (08/20 0849) Weight:  [82.1 kg] 82.1 kg (08/19 2025)  Physical Exam:  General: alert and no distress  Lungs: clear to auscultation bilaterally Breasts: normal appearance, no masses or tenderness Heart: regular rate and rhythm, S1, S2 normal, no murmur, click, rub or gallop Abdomen: soft, non-tender; bowel sounds normal; no masses,  no organomegaly Pelvis: Lochia: appropriate, Uterine Fundus: firm Extremities: DVT Evaluation: No evidence of DVT seen on physical exam. Negative Homan's sign. No cords or calf tenderness. No significant calf/ankle edema.    Labs:  Results for orders placed or performed during the hospital encounter of 08/25/19  SARS Coronavirus 2 by RT PCR (hospital order, performed in [facility] hospital lab) Nasopharyngeal Nasopharyngeal Swab   Specimen: Nasopharyngeal Swab  Result Value Ref Range   SARS Coronavirus 2 NEGATIVE NEGATIVE  CBC  Result Value Ref Range   WBC 6.7 4.0 - 10.5 K/uL   RBC 4.21 3.87 - 5.11 MIL/uL   Hemoglobin 12.6 12.0 - 15.0 g/dL   HCT 09.6 36 - 46 %   MCV 87.2 80.0 - 100.0 fL   MCH 29.9 26.0 - 34.0 pg   MCHC 34.3 30.0 - 36.0 g/dL   RDW 28.3 66.2 - 94.7 %   Platelets 261 150 - 400 K/uL   nRBC 0.0 0.0 - 0.2 %  CBC  Result Value Ref Range   WBC 6.7 4.0 - 10.5 K/uL   RBC 3.82 (L) 3.87 - 5.11 MIL/uL   Hemoglobin 11.5 (L) 12.0 - 15.0 g/dL   HCT 65.4 (L) 36 - 46 %   MCV 89.0 80.0 - 100.0 fL   MCH 30.1 26.0 - 34.0 pg   MCHC 33.8 30.0 - 36.0 g/dL   RDW 65.0 35.4 - 65.6 %   Platelets 201 150 - 400 K/uL   nRBC 0.0 0.0 - 0.2 %  Glucose, capillary  Result Value Ref Range   Glucose-Capillary 102 (H) 70 - 99 mg/dL  Type and screen  [facility]  Result Value Ref Range   ABO/RH(D) A POS    Antibody Screen NEG    Sample Expiration      08/28/2019,2359 Performed at [facility], [address] [place], [place] [address]     Assessment/Plan: Doing well postpartum  Breastfeeding, Lactation consult as needed Contraception condoms GDM, diet controlled. Ordered fasting blood sugar, to be performed tomorrow as patient has already eaten this morning.  Plan for discharge tomorrow.    LOS: 1 day   Hildred Laser, MD [facility] 08/26/2019 11:36 AM

## 2019-08-26 NOTE — Lactation Note (Signed)
This note was copied from a baby's chart. Lactation Consultation Note  Patient Name: Sierra Reynolds [id number] Date: 08/26/2019 Reason for consult: Initial assessment   Maternal Data Formula Feeding for Exclusion: No Does the patient have breastfeeding experience prior to this delivery?: Yes  Feeding Feeding Type: Breast Fed Baby latches easily to breast in cradle hold on left breast LATCH Score Latch: Grasps breast easily, tongue down, lips flanged, rhythmical sucking.  Audible Swallowing: Spontaneous and intermittent  Type of Nipple: Everted at rest and after stimulation  Comfort (Breast/Nipple): Soft / non-tender  Hold (Positioning): No assistance needed to correctly position infant at breast.  LATCH Score: 10  Interventions    Lactation Tools Discussed/Used WIC Program: Yes [facility] name and phone no. Written on white board  Consult Status Consult Status: PRN    Dyann Kief 08/26/2019, 6:40 PM

## 2019-08-27 DIAGNOSIS — O09899 Supervision of other high risk pregnancies, unspecified trimester: Secondary | ICD-10-CM

## 2019-08-27 DIAGNOSIS — Z2839 Other underimmunization status: Secondary | ICD-10-CM

## 2019-08-27 LAB — VARICELLA ZOSTER ANTIBODY, IGG: Varicella IgG: <135 index — ABNORMAL LOW (ref >165)

## 2019-08-27 MED ORDER — MEASLES, MUMPS & RUBELLA VAC IJ SOLR
0.5000 mL | Freq: Once | INTRAMUSCULAR | Status: AC
  Administered 2019-08-27: 0.5 mL via SUBCUTANEOUS
  Filled 2019-08-27 (×2): qty 0.5

## 2019-08-27 MED ORDER — VARICELLA VIRUS VACCINE LIVE 1350 PFU/0.5ML IJ SUSR
0.5000 mL | Freq: Once | INTRAMUSCULAR | Status: AC
  Administered 2019-08-27: 0.5 mL via SUBCUTANEOUS
  Filled 2019-08-27 (×2): qty 0.5

## 2019-08-27 MED ORDER — IBUPROFEN 600 MG PO TABS: 600.0000 mg | ORAL_TABLET | Freq: Four times a day (QID) | ORAL | 0 refills | Status: AC

## 2019-08-27 MED ORDER — DOCUSATE SODIUM 100 MG PO CAPS: 100.0000 mg | ORAL_CAPSULE | Freq: Two times a day (BID) | ORAL | 1 refills | Status: AC | PRN

## 2019-08-27 NOTE — Discharge Summary (Signed)
Postpartum Discharge Summary     Patient Name: Sierra Reynolds DOB: 03/21/1990 MRN: [id number]  Date of admission: 08/25/2019 Delivery date:08/25/2019  Delivering provider: Rubie Maid  Date of discharge: 08/27/2019  Admitting diagnosis: Labor and delivery indication for care or intervention [O75.9] Intrauterine pregnancy: [redacted]w[redacted]d    Secondary diagnosis:  Active Problems:   Late prenatal care affecting pregnancy in second trimester   Gestational diabetes mellitus   Obesity BMI=34.9   Labor and delivery indication for care or intervention   Maternal varicella, non-immune  Additional problems: None   Discharge diagnosis: Term Pregnancy Delivered and GDM A1                                              Post partum procedures:None Augmentation: None Complications: None  Hospital course: Onset of Labor With Vaginal Delivery      29y.o. yo G3P3003 at 31w2das admitted in Active Labor on 08/25/2019. Patient had an uncomplicated labor course as follows:  Membrane Rupture Time/Date: 7:55 PM ,08/25/2019   Delivery Method:Vaginal, Spontaneous  Episiotomy: None  Lacerations:  1st degree;Perineal  Patient had an uncomplicated postpartum course.  She is ambulating, tolerating a regular diet, passing flatus, and urinating well. Patient is discharged home in stable condition on 08/27/19.  Newborn Data: Birth date:08/25/2019  Birth time:7:55 PM  Gender:Female  Living status:Living  Apgars:8 ,9  Weight:3350 g   Magnesium Sulfate received: No BMZ received: No Rhophylac:No MMR:Yes and Varivax T-DaP:Given prenatally Transfusion:No  Physical exam  Vitals:   08/26/19 1213 08/26/19 1542 08/26/19 2303 08/27/19 0820  BP: 110/62 (!) 101/59 (!) 100/58 100/63  Pulse: 60 63 60 61  Resp: 14  18 18   Temp:   98.3 F (36.8 C) 98.3 F (36.8 C)  TempSrc:   Oral Oral  SpO2:  98% 98% 99%  Weight:      Height:       General: alert and no distress Lochia: appropriate Uterine Fundus:  firm Incision: N/A DVT Evaluation: No evidence of DVT seen on physical exam. Negative Homan's sign. No cords or calf tenderness. No significant calf/ankle edema. Labs: Lab Results  Component Value Date   WBC 6.7 08/26/2019   HGB 11.5 (L) 08/26/2019   HCT 34.0 (L) 08/26/2019   MCV 89.0 08/26/2019   PLT 201 08/26/2019   CMP Latest Ref Rng & Units 03/08/2019  Glucose 65 - 99 mg/dL 178(H)  BUN 6 - 20 mg/dL 5(L)  Creatinine 0.57 - 1.00 mg/dL 0.55(L)  Sodium 134 - 144 mmol/L 134  Potassium 3.5 - 5.2 mmol/L 4.1  Chloride 96 - 106 mmol/L 101  CO2 20 - 29 mmol/L 22  Calcium 8.7 - 10.2 mg/dL 9.1  Total Protein 6.0 - 8.5 g/dL 6.6  Total Bilirubin 0.0 - 1.2 mg/dL 0.4  Alkaline Phos 39 - 117 IU/L 67  AST 0 - 40 IU/L 18  ALT 0 - 32 IU/L 10   Edinburgh Score: Edinburgh Postnatal Depression Scale Screening Tool 08/27/2019  I have been able to laugh and see the funny side of things. 0  I have looked forward with enjoyment to things. 0  I have blamed myself unnecessarily when things went wrong. 0  I have been anxious or worried for no good reason. 0  I have felt scared or panicky for no good reason. 0  Things have been  getting on top of me. 0  I have been so unhappy that I have had difficulty sleeping. 1  I have felt sad or miserable. 0  I have been so unhappy that I have been crying. 0  The thought of harming myself has occurred to me. 0  Edinburgh Postnatal Depression Scale Total 1      After visit meds:  Allergies as of 08/27/2019   No Known Allergies     Medication List    STOP taking these medications   Accu-Chek Nano SmartView w/Device Kit   Accu-Chek SmartView test strip Generic drug: glucose blood   Accu-Chek Softclix Lancets lancets     TAKE these medications   docusate sodium 100 MG capsule Commonly known as: COLACE Take 1 capsule (100 mg total) by mouth 2 (two) times daily as needed.   ibuprofen 600 MG tablet Commonly known as: ADVIL Take 1 tablet (600 mg  total) by mouth every 6 (six) hours.   Prenatal Vitamins 28-0.8 MG Tabs Take 1 tablet by mouth daily.        Discharge home in stable condition Infant Feeding: Breast Infant Disposition:home with mother Discharge instruction: per After Visit Summary and Postpartum booklet. Activity: Advance as tolerated. Pelvic rest for 6 weeks.  Diet: routine diet Anticipated Birth Control: Condoms Postpartum Appointment:4-6 months Additional Postpartum F/U: as determined by [facility] Future Appointments:No future appointments. Follow up Visit:  Follow-up Information    Department, [facility]. Schedule an appointment as soon as possible for a visit.   Why: for postpartum follow up Contact information: [place] [phone]                   08/27/2019 Rubie Maid, MD

## 2019-08-27 NOTE — Progress Notes (Signed)
Pt discharged with infant. Discharge instructions, prescriptions, and follow up appointments given to and reviewed with patient. Pt verbalized understanding. Escorted out by Runner, broadcasting/film/video.

## 2019-08-28 LAB — RPR: RPR Ser Ql: NONREACTIVE — AB

## 2019-08-28 LAB — MEASLES/MUMPS/RUBELLA IMMUNITY
Mumps IgG: 12.1 AU/mL (ref >10.9)
Rubella: 6.17 index (ref >0.99)
Rubeola IgG: <13.5 AU/mL — ABNORMAL LOW (ref >16.4)

## 2019-08-29 ENCOUNTER — Ambulatory Visit: Payer: Self-pay

## 2019-08-31 ENCOUNTER — Ambulatory Visit: Payer: Self-pay

## 2019-10-06 ENCOUNTER — Other Ambulatory Visit: Payer: Self-pay

## 2019-10-06 ENCOUNTER — Encounter: Payer: Self-pay | Admitting: Physician Assistant

## 2019-10-06 ENCOUNTER — Ambulatory Visit: Payer: Self-pay | Admitting: Physician Assistant

## 2019-10-06 ENCOUNTER — Other Ambulatory Visit: Payer: Self-pay | Admitting: Physician Assistant

## 2019-10-06 DIAGNOSIS — Z283 Underimmunization status: Secondary | ICD-10-CM

## 2019-10-06 DIAGNOSIS — O09899 Supervision of other high risk pregnancies, unspecified trimester: Secondary | ICD-10-CM

## 2019-10-06 DIAGNOSIS — O24410 Gestational diabetes mellitus in pregnancy, diet controlled: Secondary | ICD-10-CM

## 2019-10-06 DIAGNOSIS — Z3009 Encounter for other general counseling and advice on contraception: Secondary | ICD-10-CM

## 2019-10-06 DIAGNOSIS — E66811 Obesity, class 1: Secondary | ICD-10-CM

## 2019-10-06 DIAGNOSIS — Z6834 Body mass index (BMI) 34.0-34.9, adult: Secondary | ICD-10-CM

## 2019-10-06 DIAGNOSIS — E669 Obesity, unspecified: Secondary | ICD-10-CM

## 2019-10-06 NOTE — Progress Notes (Signed)
Here today for 5.6 week PP Exam. NSVD female 08/25/2019 @ [facility]. Is breastfeeding. Prefers condoms for birth control. Declines all STD screening today. Tawny Hopping, RN

## 2019-10-06 NOTE — Progress Notes (Signed)
Post Partum Exam  Sierra Reynolds is a 29 y.o. [id number] female who presents for a postpartum visit. She is 6 weeks postpartum following a spontaneous vaginal delivery. I have fully reviewed the prenatal and intrapartum course. The delivery was at 39 2/7 gestational weeks.  Anesthesia: none. Postpartum course has been uneventful. Baby's course has been uneventful. Baby is feeding by Breast Bleeding no bleeding. Bowel function is normal. Bladder function is normal. Patient is not sexually active. Contraception method is abstinence.   Postpartum depression screening:  Edinburgh Postnatal Depression Scale - 10/06/19 1346      Edinburgh Postnatal Depression Scale:  In the Past 7 Days   I have been able to laugh and see the funny side of things. 0    I have looked forward with enjoyment to things. 0    I have blamed myself unnecessarily when things went wrong. 0    I have been anxious or worried for no good reason. 0    I have felt scared or panicky for no good reason. 0    Things have been getting on top of me. 0    I have been so unhappy that I have had difficulty sleeping. 0    I have felt sad or miserable. 0    I have been so unhappy that I have been crying. 0    The thought of harming myself has occurred to me. 0    Edinburgh Postnatal Depression Scale Total 0           Last pap smear done 02/19/17  and was Normal  Review of Systems A comprehensive review of systems was negative except for: Eyes: positive for occ double vision with position changes "sees butterflies" Genitourinary: positive for occ am nausea Musculoskeletal: positive for occ bilat resting calf pain Neurological: positive for intermittent mild headache up to 3 wks postpartum, pain at top of head, spontaneously resolved    Objective:  BP 95/60   Wt 166 lb 12.8 oz (75.7 kg)   Breastfeeding Yes   BMI 32.58 kg/m   Gen: well appearing, NAD HEENT: no scleral icterus CV: RR Lung: Normal  WOB Breast:performed-yes  Ext: warm well perfused  GU: external genitalia normal, no lesions or inguinal LAD, vagina with scant creamy discharge, no lesions, cervix parous without lesions, bimanual without tenderness or adnexal mass. Rectal: performed -  not indicated       Assessment:    Normal postpartum exam. Pap smear not done at today's visit.   Plan:   Essential components of care per ACOG recommendations for Comprehensive Postpartum exam:  1.  Mood and well being: Patient with negative depression screening today. Reviewed local resources for support. EPDS is low risk. Reviewed resources and that mood sx in first year after pregnancy are considered related to pregnancy and to reach out for help at [facility] if needed. Discussed [facility] as link to care and availability of LCSW for counseling  - Patient does not use tobacco. - hx of drug use? No   2. Infant care and feeding:  -Patient currently breastmilk feeding? Yes If breastmilk feeding discussed return to work and pumping. If needed, patient was provided letter for work to allow for every 2-3 hr pumping breaks, and to be granted a private location to express breastmilk and refrigerated area to store breastmilk. Reviewed importance of draining breast regularly to support lactation.   -Recommended patient engage with WIC/BFpeer counselors  -Counseled to sign new child up for [facility] services -  Social determinants of health (SDOH) reviewed in EPIC. No concerns  3. Sexuality, contraception and birth spacing  Contraception: Contraception counseling: Reviewed all forms of birth control options in the tiered based approach. available including abstinence; over the counter/barrier methods; hormonal contraceptive medication including pill, patch, ring, injection,contraceptive implant; hormonal and nonhormonal IUDs; permanent sterilization options including vasectomy and the various tubal sterilization modalities. Risks, benefits, and typical  effectiveness rates were reviewed.  Questions were answered.  Written information was also given to the patient to review.  Patient desires condoms, this was provided for patient. She will follow up in  12 mo for surveillance.  She was told to call with any further questions, or with any concerns about this method of contraception.  Emphasized use of condoms 100% of the time for STI prevention.  ECP not indicated today. - Patient does not want a pregnancy in the next year.  Desired family size is more than 3 children.  - Reviewed forms of contraception in tiered fashion. Patient desired condoms today.   - Discussed birth spacing of 18 months  4. Sleep and fatigue -Encouraged family/partner/community support of 4 hrs of uninterrupted sleep to help with mood and fatigue  5. Physical Recovery  - Discussed patients delivery and complications - Patient had a 1st degree laceration, perineal healing reviewed. Patient expressed understanding - Patient has urinary incontinence? No - Patient is safe to resume physical and sexual activity  6.  Health Maintenance/Chronic Disease - Last pap smear performed 02/20/19 and was normal with negative HPV. Pt enc to see PCP if recent sx persist or worsen. Not due for Mammogram Encouraged COVID vaccine - pt plans to get when she returns for OGTT.  7. Diet controlled gestational diabetes mellitus (GDM), antepartum Pt not fasting this afternoon. To return to nurse clinic for 2hour oral glucose tolerance test.   Patient given handout about PCP care in the community Given MVI per family planning program guidelines and availability  Follow up in: 1 week or as needed.

## 2019-10-17 ENCOUNTER — Other Ambulatory Visit: Payer: Self-pay

## 2019-10-17 ENCOUNTER — Other Ambulatory Visit (LOCAL_COMMUNITY_HEALTH_CENTER): Payer: Self-pay

## 2019-10-17 DIAGNOSIS — O99810 Abnormal glucose complicating pregnancy: Secondary | ICD-10-CM

## 2019-10-17 NOTE — Progress Notes (Signed)
Here for 2 h GTT as ordered by A. Streilein,  PA-C dated 10/06/2019.  NPO since 10/16/19 at 7 pm. Instructions given to stay on first floor, remain npo, report nausea/vomiting,  and to report to lab at designated time for blood draw.  Escorted to lab. Questions answered and reports understanding.  Juliene Pina, interpreter. Jerel Shepherd, RN

## 2019-10-18 LAB — GLUCOSE TOLERANCE, 2 HOURS
Glucose, 2 hour: 185 mg/dL — ABNORMAL HIGH (ref 65–139)
Glucose, GTT - Fasting: 78 mg/dL (ref 65–99)

## 2019-10-19 ENCOUNTER — Encounter: Payer: Self-pay | Admitting: Advanced Practice Midwife

## 2019-10-19 ENCOUNTER — Telehealth: Payer: Self-pay

## 2019-10-19 DIAGNOSIS — R7302 Impaired glucose tolerance (oral): Secondary | ICD-10-CM | POA: Insufficient documentation

## 2019-10-19 NOTE — Telephone Encounter (Signed)
TC to patient to inform of elevated 2 hour gtt at her PP appointment. Patient counseled to see primary provider as soon as possible to follow-up. Patient states she will call [facility] for appointment. Patient with questions about whether she has prediabetes, patient counseled that evaluation needed with PCP to determine situation and plans. Patient states she needs some dental work done, counseled to go ahead with that, and states she will call PCP right away. Interpreter V. Olmedo.Burt Knack, RN

## 2021-02-06 ENCOUNTER — Ambulatory Visit: Payer: Self-pay

## 2021-02-15 ENCOUNTER — Encounter: Payer: Self-pay | Admitting: Family Medicine

## 2021-02-15 ENCOUNTER — Other Ambulatory Visit: Payer: Self-pay

## 2021-02-15 ENCOUNTER — Ambulatory Visit (LOCAL_COMMUNITY_HEALTH_CENTER): Payer: Self-pay | Admitting: Family Medicine

## 2021-02-15 VITALS — BP 103/59 | Ht 59.1 in | Wt 154.2 lb

## 2021-02-15 DIAGNOSIS — Z3009 Encounter for other general counseling and advice on contraception: Secondary | ICD-10-CM

## 2021-02-15 DIAGNOSIS — Z1272 Encounter for screening for malignant neoplasm of vagina: Secondary | ICD-10-CM

## 2021-02-15 DIAGNOSIS — Z3200 Encounter for pregnancy test, result unknown: Secondary | ICD-10-CM

## 2021-02-15 DIAGNOSIS — Z30017 Encounter for initial prescription of implantable subdermal contraceptive: Secondary | ICD-10-CM

## 2021-02-15 DIAGNOSIS — Z01419 Encounter for gynecological examination (general) (routine) without abnormal findings: Secondary | ICD-10-CM

## 2021-02-15 LAB — PREGNANCY, URINE: Preg Test, Ur: NEGATIVE

## 2021-02-15 MED ORDER — ETONOGESTREL 68 MG ~~LOC~~ IMPL
68.0000 mg | DRUG_IMPLANT | Freq: Once | SUBCUTANEOUS | Status: AC
  Administered 2021-02-15: 68 mg via SUBCUTANEOUS

## 2021-02-15 NOTE — Progress Notes (Signed)
Pt here for PE, Pap and Nexplanon insertion.  Pt notified of negative PT.  Nexplanon inserted by Provider, without any complications.  Windle Guard, RN

## 2021-02-18 NOTE — Progress Notes (Signed)
Please see other note with same date   Junious Dresser, FNP

## 2021-02-18 NOTE — Progress Notes (Signed)
[facility] DEPARTMENT [facility] [address] Main Number: [phone]    Family Planning Visit- Initial Visit  Subjective:  Sierra Reynolds is a 31 y.o.  [phone]   being seen today for an initial annual visit and to discuss reproductive life planning.  The patient is currently using No Method - No Contraceptive Precautions for pregnancy prevention. Patient reports   does not want a pregnancy in the next year.  Patient has the following medical conditions has History of abnormal cervical Pap smear; Dental abscess; Gestational diabetes mellitus; Obesity BMI=34.9; and Impaired glucose tolerance pp 10/17/19 on their problem list.  Chief Complaint  Patient presents with   Contraception    PE and [facility]    Patient reports here for physical and Nexplanon insertion   Patient denies any problems or concerns    Body mass index is 31.04 kg/m. - Patient is eligible for diabetes screening based on BMI and age >77?  not applicable HA1C ordered? no  Patient reports 1  partner/s in last year. Desires STI screening?  No - Denies need for testing   Has patient been screened once for HCV in the past?  No  No results found for: HCVAB  Does the patient have current drug use (including MJ), have a partner with drug use, and/or has been incarcerated since last result? No  If yes-- Screen for HCV through [facility]   Does the patient meet criteria for HBV testing? No  Criteria:  -Household, sexual or needle sharing contact with HBV -History of drug use -HIV positive -Those with known Hep C   Health Maintenance Due  Topic Date Due   HEMOGLOBIN A1C  Never done   COVID-19 Vaccine (1) Never done   FOOT EXAM  Never done   OPHTHALMOLOGY EXAM  Never done   URINE MICROALBUMIN  Never done   Hepatitis C Screening  Never done   PAP SMEAR-Modifier  02/20/2020   INFLUENZA VACCINE  08/06/2020    Review of Systems  Constitutional:  Negative for chills,  fever, malaise/fatigue and weight loss.  HENT:  Negative for congestion, hearing loss and sore throat.   Eyes:  Negative for blurred vision, double vision and photophobia.  Respiratory:  Negative for shortness of breath.   Cardiovascular:  Negative for chest pain.  Gastrointestinal:  Negative for abdominal pain, blood in stool, constipation, diarrhea, heartburn, nausea and vomiting.  Genitourinary:  Negative for dysuria and frequency.  Musculoskeletal:  Negative for back pain, joint pain and neck pain.  Skin:  Negative for itching and rash.  Neurological:  Negative for dizziness, weakness and headaches.  Endo/Heme/Allergies:  Does not bruise/bleed easily.  Psychiatric/Behavioral:  Negative for depression, substance abuse and suicidal ideas.    The following portions of the patient's history were reviewed and updated as appropriate: allergies, current medications, past family history, past medical history, past social history, past surgical history and problem list. Problem list updated.   See flowsheet for other program required questions.  Objective:   Vitals:   02/15/21 1424  BP: (!) 103/59  Weight: 154 lb 3.2 oz (69.9 kg)  Height: 4' 11.1" (1.501 m)    Physical Exam Vitals and nursing note reviewed.  Constitutional:      Appearance: Normal appearance.  HENT:     Head: Normocephalic and atraumatic.     Mouth/Throat:     Mouth: Mucous membranes are moist.     Dentition: Normal dentition. No dental caries.  Pharynx: No oropharyngeal exudate or posterior oropharyngeal erythema.  Eyes:     General: No scleral icterus. Neck:     Thyroid: No thyroid mass, thyromegaly or thyroid tenderness.  Cardiovascular:     Rate and Rhythm: Normal rate and regular rhythm.     Pulses: Normal pulses.     Heart sounds: Normal heart sounds.  Pulmonary:     Effort: Pulmonary effort is normal.     Breath sounds: Normal breath sounds.  Chest:     Comments: Breasts:        Right: Normal.  No swelling, mass, nipple discharge, skin change or tenderness.        Left: Normal. No swelling, mass, nipple discharge, skin change or tenderness.   Abdominal:     General: Abdomen is flat. Bowel sounds are normal.     Palpations: Abdomen is soft.  Genitourinary:    General: Normal vulva.     Rectum: Normal.     Comments: External genitalia without, lice, nits, erythema, edema , lesions or inguinal adenopathy. Vagina with normal mucosa and discharge and pH equals 4.  Cervix without visual lesions, uterus firm, mobile, non-tender, no masses, CMT adnexal fullness or tenderness.   Musculoskeletal:        General: Normal range of motion.     Cervical back: Normal range of motion and neck supple.  Skin:    General: Skin is warm and dry.  Neurological:     General: No focal deficit present.     Mental Status: She is alert and oriented to person, place, and time.  Psychiatric:        Behavior: Behavior normal.      Assessment and Plan:  Sierra Reynolds is a 31 y.o. female presenting to the [facility] Department for an initial annual wellness/contraceptive visit    1. Smear, vaginal, as part of routine gynecological examination Well woman exam  PAP today  CBE today  - IGP, Aptima HPV  2. Encounter for pregnancy test, result unknown Discussed with patient that PT will only determine pregnancy from last sex two weeks ago.  Pt assured that her and her partner were careful, they always use condoms and he withdrew and want to have nexplanon today.  - Pregnancy, urine  3. Encounter for initial prescription of implantable subdermal contraceptive  Patient identified, informed consent performed, consent signed.   Patient does understand that irregular bleeding is a very common side effect of this medication. She was advised to have backup contraception after placement. Patient was determined to meet WHO criteria for not being pregnant. Appropriate time out taken.  The  insertion site was identified 8-10 cm (3-4 inches) from the medial epicondyle of the humerus and 3-5 cm (1.25-2 inches) posterior to (below) the sulcus (groove) between the biceps and triceps muscles of the patient's left arm and marked.  Patient was prepped with alcohol swab and then injected with 3 ml of 1% lidocaine.  Arm was prepped with chlorhexidene, Nexplanon removed from packaging,  Device confirmed in needle, then inserted full length of needle and withdrawn per handbook instructions. Nexplanon was able to palpated in the patient's arm; patient palpated the insert herself. There was minimal blood loss.  Patient insertion site covered with guaze and a pressure bandage to reduce any bruising.  The patient tolerated the procedure well and was given post procedure instructions.   - etonogestrel (NEXPLANON) implant 68 mg Nexplanon:   Counseled patient to take OTC analgesic starting as soon  as lidocaine starts to wear off and take regularly for at least 48 hr to decrease discomfort.  Specifically to take with food or milk to decrease stomach upset and for IB 600 mg (3 tablets) every 6 hrs; IB 800 mg (4 tablets) every 8 hrs; or Aleve 2 tablets every 12 hrs.   4. Family planning counseling  Contraception counseling: Reviewed all forms of birth control options in the tiered based approach. available including abstinence; over the counter/barrier methods; hormonal contraceptive medication including pill, patch, ring, injection,contraceptive implant, ECP; hormonal and nonhormonal IUDs; permanent sterilization options including vasectomy and the various tubal sterilization modalities. Risks, benefits, and typical effectiveness rates were reviewed.  Questions were answered.  Written information was also given to the patient to review.  Patient desires Hormonal Implant, this was prescribed for patient.    The patient will follow up in  as needed for surveillance.  The patient was told to call with any further  questions, or with any concerns about this method of contraception.  Emphasized use of condoms 100% of the time for STI prevention.  Patient was not offered ECP based on pt last sex   [facility] agency interpreter used for Spanish interpretation.     No follow-ups on file.  No future appointments.  Wendi Snipes, FNP

## 2021-02-20 LAB — IGP, APTIMA HPV
HPV Aptima: NEGATIVE
PAP Smear Comment: 0

## 2021-08-12 IMAGING — US US MFM OB COMP +14 WKS
2 series · 12 of 28 positions shown · non-contrast
Comparison: none

PATIENT INFO:

             VANGIE
PERFORMED BY:
                   Sonographer
                   BLADE AUJLA
SERVICE(S) PROVIDED:
 ----------------------------------------------------------------------
INDICATIONS:
  20 weeks gestation of pregnancy
FETAL EVALUATION:
 Num Of Fetuses:         1
 Fetal Heart Rate(bpm):  147
 Cardiac Activity:       Present
 Presentation:           Cephalic
 Placenta:               Posterior, fundal
                             Largest Pocket(cm)
BIOMETRY:
 BPD:      48.4  mm     G. Age:  20w 4d         40  %    CI:        70.61   %    70 - 86
                                                         FL/HC:      19.7   %    15.9 -
 HC:      183.6  mm     G. Age:  20w 5d         36  %    HC/AC:      1.18        1.06 -
 AC:      155.7  mm     G. Age:  20w 5d         40  %    FL/BPD:     74.8   %
 FL:       36.2  mm     G. Age:  21w 3d         63  %    FL/AC:      23.2   %    20 - 24
 HUM:      33.1  mm     G. Age:  21w 1d         56  %
 NFT:       5.1  mm
 CM:        3.4  mm
 Est. FW:     394  gm    0 lb 14 oz      53  %
GESTATIONAL AGE:
 LMP:           20w 6d        Date:  11/23/18                 EDD:   08/30/19
 U/S Today:     20w 6d                                        EDD:   08/30/19
 Best:          20w 6d     Det. By:  LMP  (11/23/18)          EDD:   08/30/19
ANATOMY:
 Cranium:               Normal appearance      Aortic Arch:            Normal appearance
 Cavum:                 CSP visualized         Ductal Arch:            Normal appearance
 Ventricles:            Normal appearance      Diaphragm:              Within Normal Limits
 Choroid Plexus:        Within Normal Limits   Stomach:                Seen
 Cerebellum:            Within Normal Limits   Abdomen:                Within Normal
                                                                       Limits
 Posterior Fossa:       Within Normal Limits   Abdominal Wall:         Normal appearance
 Nuchal Fold:           Within Normal Limits   Cord Vessels:           3 vessels
 Face:                  Orbits visualized      Kidneys:                Normal appearance
 Lips:                  Normal appearance      Bladder:                Seen
 Thoracic:              Within Normal Limits   Spine:                  Normal appearance
 Heart:                 4-Chamber view         Upper Extremities:      Visualized
                        appears normal
 RVOT:                  Normal appearance      Lower Extremities:      Visualized
 LVOT:                  Normal appearance
CERVIX UTERUS ADNEXA:
 Cervix
 Length:            4.9  cm.
 Comment
 Left ovary visualized appears normal, right
 ovary not well imaged

[Series 1: us mfm ob comp +14 wks · 0.22mm/px · 11 of 88 slices shown (1 of 2)]
[im 4/88]
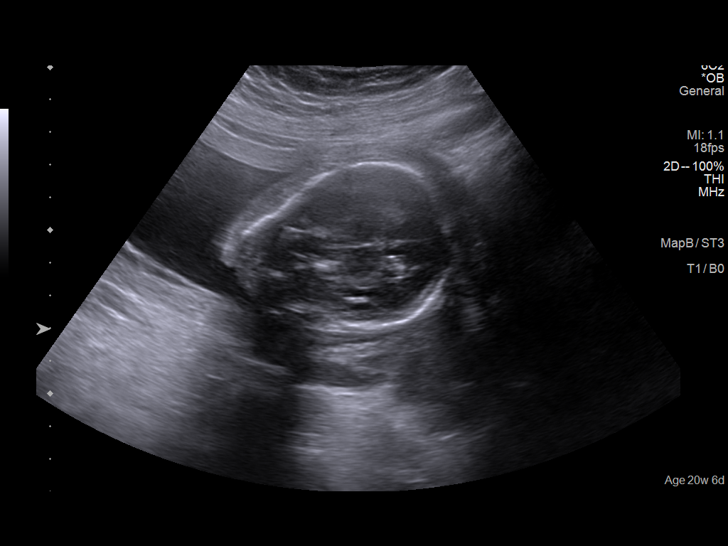
[im 11/88]
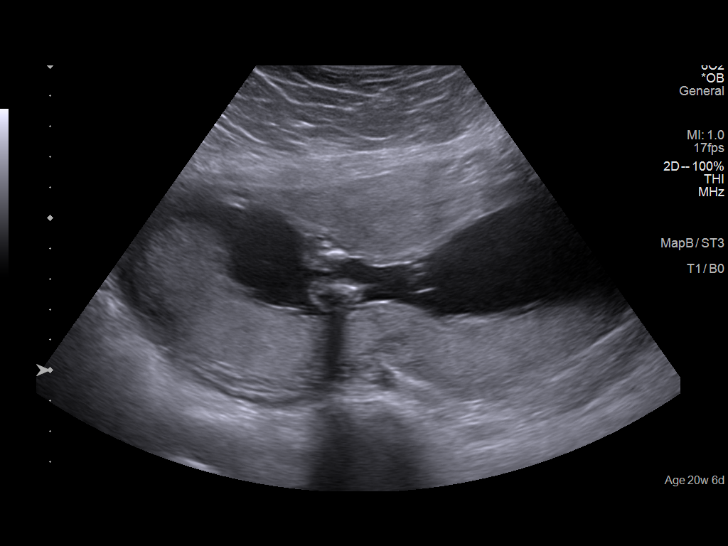
[im 18/88]
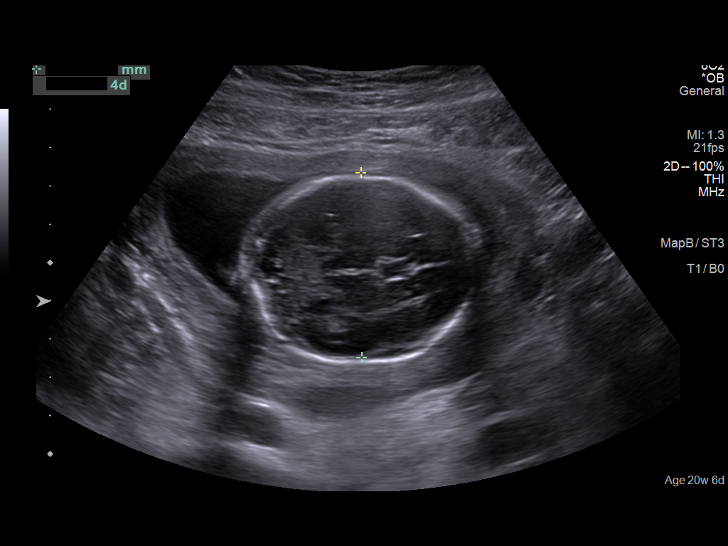
[im 28/88]
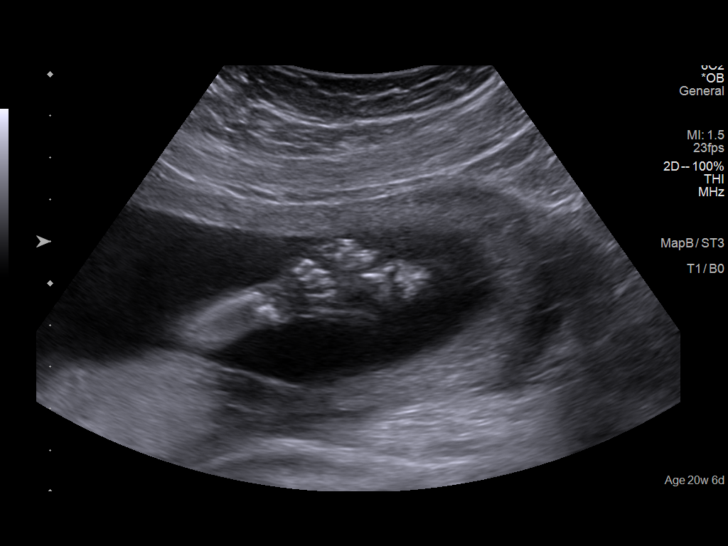
[im 35/88]
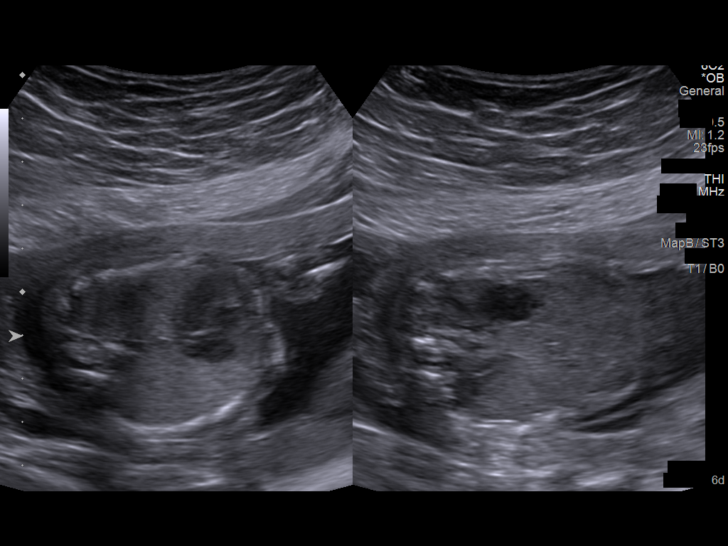
[im 42/88]
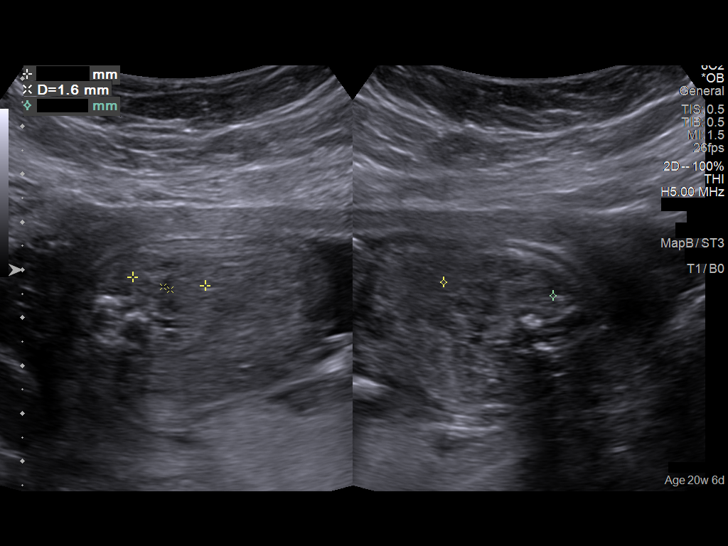
[im 53/88]
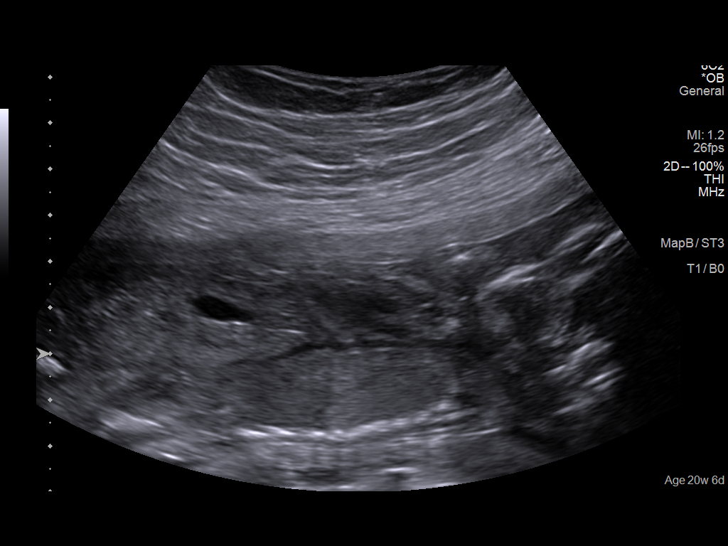
[im 60/88]
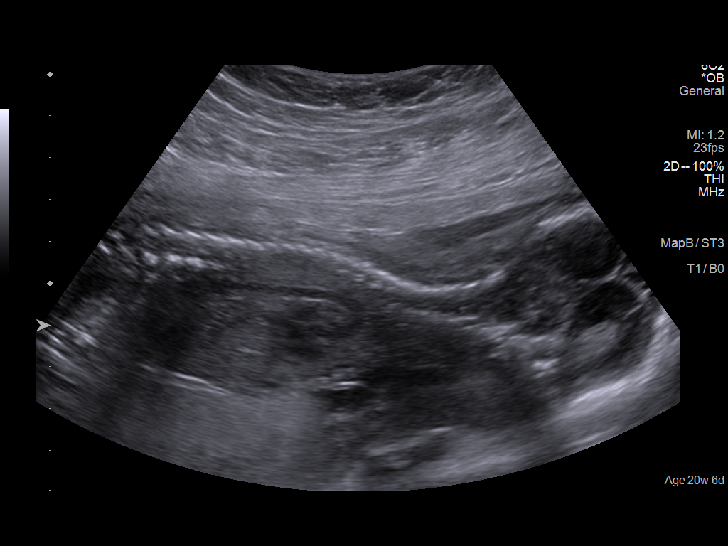
[im 67/88]
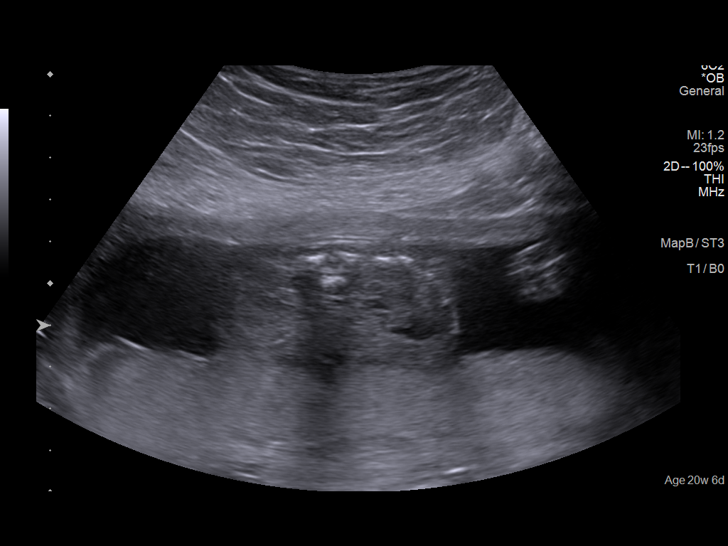
[im 77/88]
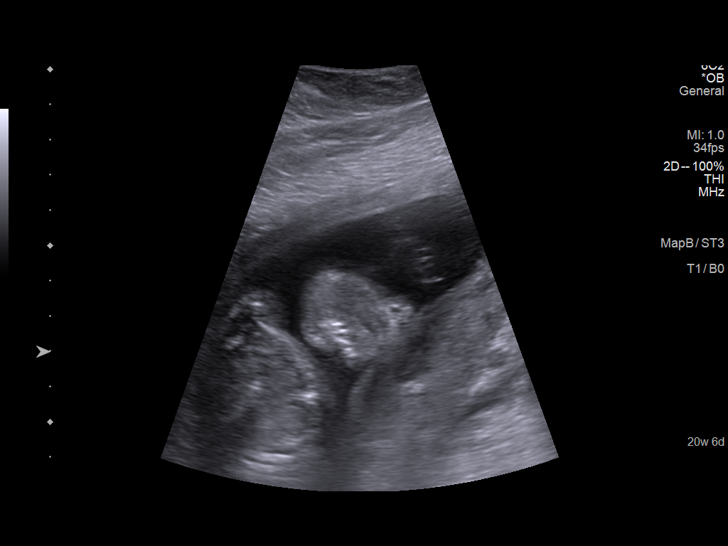
[im 84/88]
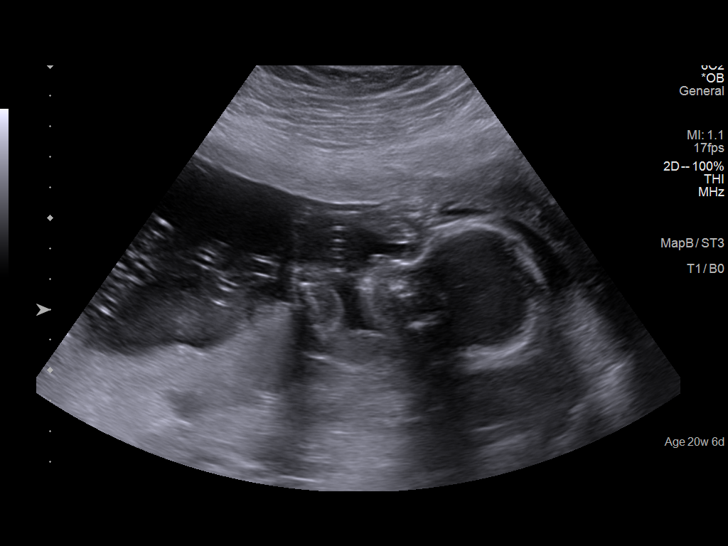

[Series 1001: us mfm ob comp +14 wks · 0.22mm/px · 1 of 5 slices shown (2 of 2)]
[im 1/5]
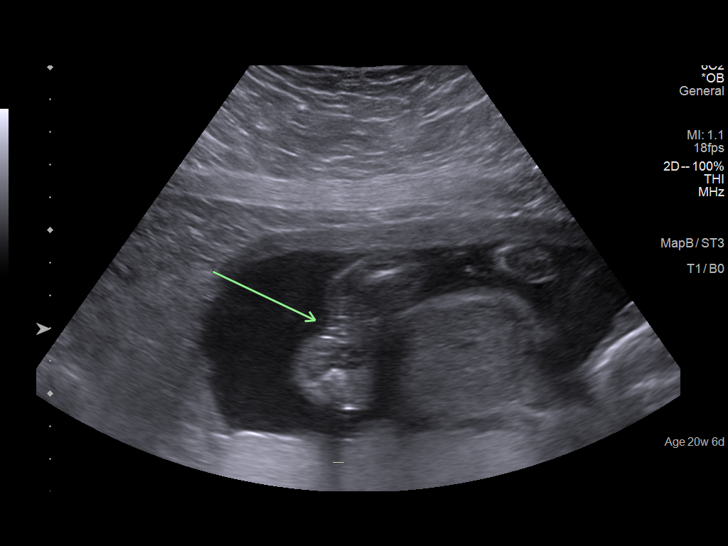

[12 of 28 positions shown; findings below may reference images not displayed]

IMPRESSION: Dear Dr.   BILLIOT,

 Thank you for referring your patient  for a fetal anatomical
 survey. A detailed scan was done due to a BMI of 35 . The
 patient declined genetic testing at [facility] .

 There is a singleton gestation with subjectively normal
 amniotic fluid volume.
 She is dated by her LMP of 11/23/2018 at 20w 6d.

 The fetal biometry correlates with established dating.

 Detailed evaluation of the fetal anatomy was performed.  The
 fetal anatomical survey appears within normal limits within
 the resolution of ultrasound as described above.
 Thank you for allowing us to participate in your patient's care.

 assistance.

## 2022-02-14 ENCOUNTER — Ambulatory Visit (LOCAL_COMMUNITY_HEALTH_CENTER): Payer: Self-pay

## 2022-02-14 DIAGNOSIS — Z23 Encounter for immunization: Secondary | ICD-10-CM

## 2022-02-14 DIAGNOSIS — Z719 Counseling, unspecified: Secondary | ICD-10-CM

## 2022-02-14 NOTE — Progress Notes (Signed)
Pt seen in clinic for immigration shots. Pt has no health insurance and prefers to pay out of pocket. Updated [place] records and Epic Record into [place] and reviewed. Administered Hep B and Flu vaccines per pt request, tolerated well. Provided VIS and NCIR copies, informed of the next vaccine visits. Spanish interpretation was provided Mallie Mussel ID# [id number]/[place] language line. M.Vikram Tillett, LPN.
